# Patient Record
Sex: Female | Born: 2000 | Race: Black or African American | Hispanic: No | Marital: Single | Smoking: Never smoker
Health system: Southern US, Community
[De-identification: ages and names within clinical notes are randomized; demographics above are authoritative.]

## PROBLEM LIST (undated history)

## (undated) DIAGNOSIS — E109 Type 1 diabetes mellitus without complications: Secondary | ICD-10-CM

## (undated) DIAGNOSIS — F329 Major depressive disorder, single episode, unspecified: Secondary | ICD-10-CM

## (undated) DIAGNOSIS — I10 Essential (primary) hypertension: Secondary | ICD-10-CM

## (undated) DIAGNOSIS — F32A Depression, unspecified: Secondary | ICD-10-CM

## (undated) HISTORY — DX: Major depressive disorder, single episode, unspecified: F32.9

## (undated) HISTORY — DX: Depression, unspecified: F32.A

## (undated) HISTORY — DX: Type 1 diabetes mellitus without complications: E10.9

## (undated) HISTORY — DX: Essential (primary) hypertension: I10

## (undated) HISTORY — PX: NO PAST SURGERIES: SHX2092

---
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
## 2020-05-14 ENCOUNTER — Encounter: Payer: Self-pay | Admitting: Family Medicine

## 2020-05-14 ENCOUNTER — Other Ambulatory Visit: Payer: Self-pay

## 2020-05-14 ENCOUNTER — Other Ambulatory Visit (HOSPITAL_COMMUNITY)
Admission: RE | Admit: 2020-05-14 | Discharge: 2020-05-14 | Disposition: A | Discharge status: Home / Self Care | Payer: Managed Care, Other (non HMO) | Source: Ambulatory Visit | Attending: Family Medicine | Admitting: Family Medicine

## 2020-05-14 ENCOUNTER — Ambulatory Visit (INDEPENDENT_AMBULATORY_CARE_PROVIDER_SITE_OTHER): Payer: Managed Care, Other (non HMO) | Admitting: Family Medicine

## 2020-05-14 VITALS — BP 118/68 | HR 79 | Temp 98.2°F | Ht 70.0 in | Wt 162.4 lb

## 2020-05-14 DIAGNOSIS — Z114 Encounter for screening for human immunodeficiency virus [HIV]: Secondary | ICD-10-CM | POA: Diagnosis not present

## 2020-05-14 DIAGNOSIS — Z113 Encounter for screening for infections with a predominantly sexual mode of transmission: Secondary | ICD-10-CM | POA: Insufficient documentation

## 2020-05-14 DIAGNOSIS — E1065 Type 1 diabetes mellitus with hyperglycemia: Secondary | ICD-10-CM | POA: Diagnosis not present

## 2020-05-14 DIAGNOSIS — G43109 Migraine with aura, not intractable, without status migrainosus: Secondary | ICD-10-CM

## 2020-05-14 DIAGNOSIS — R110 Nausea: Secondary | ICD-10-CM

## 2020-05-14 MED ORDER — AMITRIPTYLINE HCL 25 MG PO TABS: 25.0000 mg | ORAL_TABLET | Freq: Every day | ORAL | 2 refills | Status: DC

## 2020-05-14 MED ORDER — SUMATRIPTAN SUCCINATE 100 MG PO TABS: 100.0000 mg | ORAL_TABLET | ORAL | 2 refills | Status: DC | PRN

## 2020-05-14 NOTE — Progress Notes (Signed)
Chief Complaint  Patient presents with  . Exposure to STD    Subjective: Patient is a 20 y.o. female here for STI screen.  BF cheated on her and did not use protection. No fevers, pain, bleeding, d/c or other s/s's. She has some nausea. LMP was 1 week ago.   Migraines H xof migraines, restarted 3 weeks ago. Father passed away around 1 mo ago. R sided HA lasting up to the whole day. Tylenol, Aleve, Advil that is not helpful. Will get around 10-12 HA's per week. Nothing has been helpful outside of sleep. No known triggers. No neuro s/s's, vision changes.   Past Medical History:  Diagnosis Date  . Depression   . Diabetes mellitus type I (HCC)    Follows with Endo  . Hypertension     Objective: BP 118/68 (BP Location: Left Arm, Patient Position: Sitting, Cuff Size: Normal)   Pulse 79   Temp 98.2 F (36.8 C) (Oral)   Ht 5\' 10"  (1.778 m)   Wt 162 lb 6 oz (73.7 kg)   SpO2 98%   BMI 23.30 kg/m  General: Awake, appears stated age Abd: BS+, S, NT, ND Heart: RRR Lungs: CTAB, no rales, wheezes or rhonchi. No accessory muscle use Neuro: DTR's equal and symmetric, no clonus, no cerebellar signs, 5/5 strength throughout MSK: mild ttp over R TMJ, no clicking or deviation Psych: Age appropriate judgment and insight, normal affect and mood  Assessment and Plan: Routine screening for STI (sexually transmitted infection) - Plan: Urine cytology ancillary only([place])  Screening for HIV (human immunodeficiency virus) - Plan: HIV Antibody (routine testing w rflx)  Migraine with aura and without status migrainosus, not intractable - Plan: SUMAtriptan (IMITREX) 100 MG tablet, amitriptyline (ELAVIL) 25 MG tablet  Type 1 diabetes mellitus with hyperglycemia (HCC), Chronic  Nausea - Plan: Pregnancy, urine  1/2. Screening as above. Asymptomatic.  3. Trial Imitrex. Start Elavil for prophylaxis as she is having stress from recent death in family also.  F/u in 1 mo to reck 3.  The patient  voiced understanding and agreement to the plan.  [place], DO 05/14/20  9:03 AM

## 2020-05-14 NOTE — Patient Instructions (Addendum)
Give [place] a week or so to get the results of your testing back.   Practice safe sex.  Hold off on the gum chewing or dial back over the next month or so.   Let us know if you need anything.

## 2020-05-14 NOTE — Addendum Note (Signed)
Addended by: Radene Gunning on: 05/14/2020 09:04 AM   Modules accepted: Level of Service

## 2020-05-15 LAB — URINE CYTOLOGY ANCILLARY ONLY
Chlamydia: NEGATIVE
Comment: NEGATIVE
Comment: NEGATIVE
Comment: NORMAL
Neisseria Gonorrhea: NEGATIVE
Trichomonas: NEGATIVE

## 2020-05-15 LAB — HIV ANTIBODY (ROUTINE TESTING W REFLEX): HIV 1&2 Ab, 4th Generation: NONREACTIVE

## 2020-05-15 LAB — PREGNANCY, URINE: Preg Test, Ur: NEGATIVE

## 2020-06-11 ENCOUNTER — Encounter: Payer: Self-pay | Admitting: Family Medicine

## 2020-06-11 ENCOUNTER — Ambulatory Visit (INDEPENDENT_AMBULATORY_CARE_PROVIDER_SITE_OTHER): Payer: Managed Care, Other (non HMO) | Admitting: Family Medicine

## 2020-06-11 ENCOUNTER — Other Ambulatory Visit: Payer: Self-pay

## 2020-06-11 VITALS — BP 118/68 | HR 96 | Temp 98.4°F | Ht 70.0 in | Wt 162.0 lb

## 2020-06-11 DIAGNOSIS — G43109 Migraine with aura, not intractable, without status migrainosus: Secondary | ICD-10-CM | POA: Diagnosis not present

## 2020-06-11 DIAGNOSIS — L609 Nail disorder, unspecified: Secondary | ICD-10-CM | POA: Diagnosis not present

## 2020-06-11 MED ORDER — PROPRANOLOL HCL ER 60 MG PO CP24: 60.0000 mg | ORAL_CAPSULE | Freq: Every day | ORAL | 1 refills | Status: DC

## 2020-06-11 MED ORDER — RIZATRIPTAN BENZOATE 10 MG PO TABS: 10.0000 mg | ORAL_TABLET | ORAL | 2 refills | Status: DC | PRN

## 2020-06-11 NOTE — Patient Instructions (Addendum)
Let me know if you are trying to get pregnant again.   If you do not hear anything about your referral in the next 1-2 weeks, call our office and ask for an update.  Let us know if you need anything.

## 2020-06-11 NOTE — Progress Notes (Signed)
Chief Complaint  Patient presents with  . Follow-up    Subjective: Patient is a 20 y.o. female here for f/u.  Patient was started on amitriptyline 25 mg nightly and Imitrex 100 mg as needed.  They both worked but they both made her drowsy.  Her headaches went from 10 to 12/week to around 2/week.  Severity slightly reduced as well.  She had no other side effects.  Imitrex sedated her but did help with her headaches.  No new neurologic signs or symptoms.  Patient stubbed her toe 3 months ago.  It has not grown back.  She is interested in seeing a specialist.  It does not cause her any pain or itching.  No redness or drainage.  Past Medical History:  Diagnosis Date  . Depression   . Diabetes mellitus type I (HCC)    Follows with Endo  . Hypertension     Objective: BP 118/68 (BP Location: Left Arm, Patient Position: Sitting, Cuff Size: Normal)   Pulse 96   Temp 98.4 F (36.9 C) (Oral)   Ht 5\' 10"  (1.778 m)   Wt 162 lb (73.5 kg)   SpO2 98%   BMI 23.24 kg/m  General: Awake, appears stated age HEENT: MMM, EOMi MSK: No tenderness over the cervical paraspinal musculature or suboccipital triangle, no TMJ tenderness or TTP over the temporalis bilaterally Neuro: DTRs equal and symmetric throughout, 5/5 strength throughout, no clonus, no cerebellar signs Lungs: No accessory muscle use Skin: The left great toe, there is a lateral jagged portion of the nail cut out.  There is no tenderness upon pressure upon the nail plate, there is no erythema, ecchymosis, or drainage around the nail folds. Psych: Age appropriate judgment and insight, normal affect and mood  Assessment and Plan: Migraine with aura and without status migrainosus, not intractable - Plan: rizatriptan (MAXALT) 10 MG tablet, propranolol ER (INDERAL LA) 60 MG 24 hr capsule  Nail problem - Plan: Ambulatory referral to Podiatry  1.  Chronic, adverse effect to medication.  Change Imitrex to Maxalt.  Change amitriptyline to  propranolol.  Follow-up in 4 weeks. 2.  Refer to podiatry for further evaluation.  I do not think avulsion would be appropriate given lack of pain today. The patient voiced understanding and agreement to the plan.  [place], DO 06/11/20  9:32 AM

## 2020-06-17 ENCOUNTER — Other Ambulatory Visit: Payer: Self-pay

## 2020-06-17 ENCOUNTER — Ambulatory Visit (INDEPENDENT_AMBULATORY_CARE_PROVIDER_SITE_OTHER): Payer: Managed Care, Other (non HMO) | Admitting: Podiatry

## 2020-06-17 VITALS — BP 110/85 | HR 92 | Temp 97.9°F

## 2020-06-17 DIAGNOSIS — B351 Tinea unguium: Secondary | ICD-10-CM | POA: Diagnosis not present

## 2020-06-17 DIAGNOSIS — M79676 Pain in unspecified toe(s): Secondary | ICD-10-CM

## 2020-06-17 MED ORDER — TERBINAFINE HCL 250 MG PO TABS: 250.0000 mg | ORAL_TABLET | Freq: Every day | ORAL | 0 refills | Status: DC

## 2020-06-17 NOTE — Progress Notes (Signed)
   Subjective: 20 y.o. female presenting today as a new patient for evaluation of thickening to the bilateral great toenails that has been present for a few years now.  Patient denies a history of trauma.  She does state that she works 2 jobs and is on her feet throughout most of the day.  She presents for further treatment and evaluation  Past Medical History:  Diagnosis Date  . Depression   . Diabetes mellitus type I (HCC)    Follows with Endo  . Hypertension     Objective: Physical Exam General: The patient is alert and oriented x3 in no acute distress.  Dermatology: Hyperkeratotic, discolored, thickened, onychodystrophy noted bilateral great toenails. Skin is warm, dry and supple bilateral lower extremities. Negative for open lesions or macerations.  Vascular: Palpable pedal pulses bilaterally. No edema or erythema noted. Capillary refill within normal limits.  Neurological: Epicritic and protective threshold grossly intact bilaterally.   Musculoskeletal Exam: Range of motion within normal limits to all pedal and ankle joints bilateral. Muscle strength 5/5 in all groups bilateral.   Assessment: #1 Onychomycosis of toenails bilateral halluces  Plan of Care:  #1 Patient was evaluated. #2 today we discussed different treatment options regarding onychomycosis of the toenails including oral, topical, and laser antifungal treatment modalities.  Patient opts for oral antifungal medication.  She denies a history of liver pathology or symptoms. #3 prescription for Lamisil 250 mg #90 daily 4.  Return to clinic 6 months   Felecia Shelling, DPM [facility]  Dr. Felecia Shelling, DPM    [address] [place], [place] [address]                Office [phone]  Fax [phone]

## 2020-07-10 ENCOUNTER — Ambulatory Visit (INDEPENDENT_AMBULATORY_CARE_PROVIDER_SITE_OTHER): Payer: Managed Care, Other (non HMO) | Admitting: Family Medicine

## 2020-07-10 ENCOUNTER — Encounter: Payer: Self-pay | Admitting: Family Medicine

## 2020-07-10 ENCOUNTER — Other Ambulatory Visit: Payer: Self-pay

## 2020-07-10 VITALS — BP 120/70 | HR 82 | Temp 97.9°F | Ht 70.0 in | Wt 161.1 lb

## 2020-07-10 DIAGNOSIS — G43109 Migraine with aura, not intractable, without status migrainosus: Secondary | ICD-10-CM | POA: Diagnosis not present

## 2020-07-10 DIAGNOSIS — J302 Other seasonal allergic rhinitis: Secondary | ICD-10-CM

## 2020-07-10 MED ORDER — LEVOCETIRIZINE DIHYDROCHLORIDE 5 MG PO TABS: 5.0000 mg | ORAL_TABLET | Freq: Every evening | ORAL | 2 refills | Status: DC

## 2020-07-10 MED ORDER — FLUTICASONE PROPIONATE 50 MCG/ACT NA SUSP: 2.0000 | Freq: Every day | NASAL | 2 refills | Status: DC

## 2020-07-10 MED ORDER — PROPRANOLOL HCL ER 60 MG PO CP24: 60.0000 mg | ORAL_CAPSULE | Freq: Every day | ORAL | 10 refills | Status: DC

## 2020-07-10 NOTE — Progress Notes (Signed)
Chief Complaint  Patient presents with   Follow-up    Subjective: Patient is a 20 y.o. female here for f/u headaches.  Started on Propranolol LA 60 mg/d. Doing well, compliant, no AE's. When she has to use Maxalt, it controls her headaches. She is not having any drowsiness and does not wish to change her medication at this time.   Seasonal allergies Usually affected by pollen. Over the past 3 d, started having nasal congestion, sneezing, itchy/watery eyes, sinus pressure. No coughing, fevers, ST, ear pain, sob, wheezing. Has not tried anything so far.   Past Medical History:  Diagnosis Date   Depression    Diabetes mellitus type I (HCC)    Follows with Endo   Hypertension     Objective: BP 120/70   Pulse 82   Temp 97.9 F (36.6 C) (Oral)   Ht 5\' 10"  (1.778 m)   Wt 161 lb 2 oz (73.1 kg)   SpO2 98%   BMI 23.12 kg/m  General: Awake, appears stated age HEENT: MMM, EOMi, pares patent w turbinates edematous and pale b/l, ears neg b/l Heart: RRR Lungs: CTAB, no rales, wheezes or rhonchi. No accessory muscle use Psych: Age appropriate judgment and insight, normal affect and mood  Assessment and Plan: Migraine with aura and without status migrainosus, not intractable - Plan: propranolol ER (INDERAL LA) 60 MG 24 hr capsule  Seasonal allergies - Plan: levocetirizine (XYZAL) 5 MG tablet, fluticasone (FLONASE) 50 MCG/ACT nasal spray  Chronic, stable. Cont Propranolol LA 60 mg/d.  Chronic, uncontrolled. Start Xyzal 5 mg/d and INCS. OTC options provided as well. F/u in 6 mo for CPE or prn.  The patient voiced understanding and agreement to the plan.  [place], DO 07/10/20  9:19 AM

## 2020-07-10 NOTE — Patient Instructions (Signed)
Claritin (loratadine), Allegra (fexofenadine), Zyrtec (cetirizine) which is also equivalent to Xyzal (levocetirizine); these are listed in order from weakest to strongest. Generic, and therefore cheaper, options are in the parentheses.  ? ?Flonase (fluticasone); nasal spray that is over the counter. 2 sprays each nostril, once daily. Aim towards the same side eye when you spray. ? ?There are available OTC, and the generic versions, which may be cheaper, are in parentheses. Show this to a pharmacist if you have trouble finding any of these items. ? ?Let us know if you need anything. ?

## 2020-08-21 ENCOUNTER — Other Ambulatory Visit: Payer: Self-pay | Admitting: Family Medicine

## 2020-08-21 DIAGNOSIS — G43109 Migraine with aura, not intractable, without status migrainosus: Secondary | ICD-10-CM

## 2021-01-03 ENCOUNTER — Ambulatory Visit (INDEPENDENT_AMBULATORY_CARE_PROVIDER_SITE_OTHER): Payer: Managed Care, Other (non HMO) | Admitting: Family Medicine

## 2021-01-03 ENCOUNTER — Encounter: Payer: Self-pay | Admitting: Family Medicine

## 2021-01-03 VITALS — BP 106/64 | HR 92 | Temp 98.1°F | Resp 16 | Ht 71.0 in | Wt 160.2 lb

## 2021-01-03 DIAGNOSIS — R599 Enlarged lymph nodes, unspecified: Secondary | ICD-10-CM

## 2021-01-03 MED ORDER — PREDNISONE 20 MG PO TABS
40.0000 mg | ORAL_TABLET | Freq: Every day | ORAL | 0 refills | Status: AC
Start: 2021-01-03 — End: 2021-01-08

## 2021-01-03 NOTE — Patient Instructions (Addendum)
Ice/cold pack over area for 10-15 min twice daily.  This should go away with time on its own.  If things get painful again, take the prednisone. Don't get pregnant on this medication.   OK to take Tylenol 1000 mg (2 extra strength tabs) or 975 mg (3 regular strength tabs) every 6 hours as needed.  Let us know if you need anything.

## 2021-01-03 NOTE — Progress Notes (Signed)
Chief Complaint  Patient presents with   knot under left ear and neck    Tina Fry is a 20 y.o. female here for a skin complaint.  Duration: 10 days Location: behind L ear Pruritic? No Painful? If she presses on it Drainage? No Other associated symptoms: had a fever (son had the flu) last week and diagnosed with the flu Therapies tried thus far: Tylenol  Past Medical History:  Diagnosis Date   Depression    Diabetes mellitus type I (HCC)    Follows with Endo   Hypertension     BP 106/64    Pulse 92    Temp 98.1 F (36.7 C)    Resp 16    Ht 5\' 11"  (1.803 m)    Wt 160 lb 3.2 oz (72.7 kg)    SpO2 98%    BMI 22.34 kg/m  Gen: awake, alert, appearing stated age Lungs: No accessory muscle use Skin: Periumbilical and circular lesion behind the left clear that is mildly tender to palpation, measuring approximately 0.6 cm in diameter.  There is no fluctuance, erythema, drainage.  Behind the left mandible, there is another circular and freely movable lesion measuring approximately 0.7 cm in diameter without erythema, drainage, fluctuance, or excoriation.  No drainage, erythema, TTP, fluctuance, excoriation Psych: Age appropriate judgment and insight  Reactive lymphadenopathy - Plan: predniSONE (DELTASONE) 20 MG tablet  She had the flu last week.  This is likely a reactive lymph node.  Should self resolve.  If she gets higher amounts of pain, I did send in a 5-day prednisone burst 40 mg daily.  I do not think she will likely need this.  Ice, Tylenol, try not to touch it. F/u prn. The patient voiced understanding and agreement to the plan.  [place], DO 01/03/21 12:27 PM

## 2021-01-10 ENCOUNTER — Ambulatory Visit (INDEPENDENT_AMBULATORY_CARE_PROVIDER_SITE_OTHER): Payer: Managed Care, Other (non HMO) | Admitting: Family Medicine

## 2021-01-10 ENCOUNTER — Encounter: Payer: Self-pay | Admitting: Family Medicine

## 2021-01-10 ENCOUNTER — Other Ambulatory Visit: Payer: Managed Care, Other (non HMO)

## 2021-01-10 ENCOUNTER — Telehealth: Payer: Self-pay

## 2021-01-10 VITALS — BP 120/70 | HR 74 | Temp 98.0°F | Ht 70.0 in | Wt 162.1 lb

## 2021-01-10 DIAGNOSIS — Z23 Encounter for immunization: Secondary | ICD-10-CM

## 2021-01-10 DIAGNOSIS — Z1159 Encounter for screening for other viral diseases: Secondary | ICD-10-CM | POA: Diagnosis not present

## 2021-01-10 DIAGNOSIS — E1065 Type 1 diabetes mellitus with hyperglycemia: Secondary | ICD-10-CM

## 2021-01-10 DIAGNOSIS — Z0000 Encounter for general adult medical examination without abnormal findings: Secondary | ICD-10-CM

## 2021-01-10 LAB — HEPATITIS C ANTIBODY
Hepatitis C Ab: NONREACTIVE
SIGNAL TO CUT-OFF: 0.03 (ref <1.00)

## 2021-01-10 LAB — LIPID PANEL
Cholesterol: 142 mg/dL (ref 0–200)
HDL: 53.4 mg/dL (ref >39.00)
LDL Cholesterol: 63 mg/dL (ref 0–99)
NonHDL: 88.34
Total CHOL/HDL Ratio: 3
Triglycerides: 125 mg/dL (ref 0.0–149.0)
VLDL: 25 mg/dL (ref 0.0–40.0)

## 2021-01-10 LAB — COMPREHENSIVE METABOLIC PANEL
ALT: 8 U/L (ref 0–35)
AST: 8 U/L (ref 0–37)
Albumin: 4.4 g/dL (ref 3.5–5.2)
Alkaline Phosphatase: 64 U/L (ref 39–117)
BUN: 15 mg/dL (ref 6–23)
CO2: 25 mEq/L (ref 19–32)
Calcium: 9.8 mg/dL (ref 8.4–10.5)
Chloride: 95 mEq/L — ABNORMAL LOW (ref 96–112)
Creatinine, Ser: 0.85 mg/dL (ref 0.40–1.20)
GFR: 98.17 mL/min (ref >60.00)
Glucose, Bld: 638 mg/dL (ref 70–99)
Potassium: 4.3 mEq/L (ref 3.5–5.1)
Sodium: 130 mEq/L — ABNORMAL LOW (ref 135–145)
Total Bilirubin: 0.5 mg/dL (ref 0.2–1.2)
Total Protein: 7.1 g/dL (ref 6.0–8.3)

## 2021-01-10 LAB — CBC
HCT: 40.9 % (ref 36.0–46.0)
Hemoglobin: 13.2 g/dL (ref 12.0–15.0)
MCHC: 32.3 g/dL (ref 30.0–36.0)
MCV: 84 fl (ref 78.0–100.0)
Platelets: 198 10*3/uL (ref 150.0–400.0)
RBC: 4.87 Mil/uL (ref 3.87–5.11)
RDW: 13.5 % (ref 11.5–15.5)
WBC: 3.7 10*3/uL — ABNORMAL LOW (ref 4.0–10.5)

## 2021-01-10 LAB — MICROALBUMIN / CREATININE URINE RATIO
Creatinine,U: 14.6 mg/dL
Microalb Creat Ratio: 4.8 mg/g (ref 0.0–30.0)
Microalb, Ur: <0.7 mg/dL (ref 0.0–1.9)

## 2021-01-10 MED ORDER — ETONOGESTREL-ETHINYL ESTRADIOL 0.12-0.015 MG/24HR VA RING
VAGINAL_RING | VAGINAL | 3 refills | Status: DC
Start: 2021-01-10 — End: 2021-10-17

## 2021-01-10 NOTE — Telephone Encounter (Signed)
She needs to reach out to her current endocrinologist. I can't give her my blessing to drink alcohol this weekend as this could send her into DKA/HHS. Ty.

## 2021-01-10 NOTE — Addendum Note (Signed)
Addended by: Scharlene Gloss B on: 01/10/2021 10:13 AM   Modules accepted: Orders

## 2021-01-10 NOTE — Telephone Encounter (Signed)
Called the patients cell twice/left message to call back. Called the home number//no answer/mailbox full

## 2021-01-10 NOTE — Telephone Encounter (Signed)
Patient called back and did inform of PCP instructions. She agreed to do.

## 2021-01-10 NOTE — Telephone Encounter (Signed)
CRITICAL VALUE STICKER  CRITICAL VALUE: Glucose 638  RECEIVER (on-site recipient of call): Melton Alar, [place]  DATE & TIME NOTIFIED: 12:54 PM, 01/10/21  MESSENGER (representative from lab): Saa  MD NOTIFIED: yes  TIME OF NOTIFICATION: 12:54 PM, 01/10/21  RESPONSE: PENDING

## 2021-01-10 NOTE — Progress Notes (Signed)
Chief Complaint  Patient presents with   Annual Exam     Well Woman NALEA SALCE is here for a complete physical.   Her last physical was >1 year ago.  Current diet: in general, a "healthy" diet. Current exercise: walking. Contraception? Yes Fatigue out of ordinary? No Seatbelt? Yes Advanced directive? No  Health Maintenance Pap/HPV- Due Tetanus- Yes HIV screening- Yes Hep C screening- No  Past Medical History:  Diagnosis Date   Depression    Diabetes mellitus type I (HCC)    Follows with Endo   Hypertension      Past Surgical History:  Procedure Laterality Date   NO PAST SURGERIES      Medications  Current Outpatient Medications on File Prior to Visit  Medication Sig Dispense Refill   Blood Glucose Monitoring Suppl (GLUCOCOM BLOOD GLUCOSE MONITOR) DEVI 1 Device by Misc.(Non-Drug; Combo Route) route 3 times daily.     etonogestrel-ethinyl estradiol (NUVARING) 0.12-0.015 MG/24HR vaginal ring Insert vaginally and leave in place for 3 consecutive weeks, then remove for 1 week.     fluticasone (FLONASE) 50 MCG/ACT nasal spray Place 2 sprays into both nostrils daily. 16 g 2   glucagon 1 MG injection Inject 1 mg into the vein once as needed.     glucose blood (ONETOUCH VERIO) test strip 1 each by Other route 4 (four) times daily. And lancets 4/day 100 each 12   insulin aspart (NOVOLOG FLEXPEN) 100 UNIT/ML FlexPen Inject 120 Units into the skin 3 (three) times daily before meals.     propranolol ER (INDERAL LA) 60 MG 24 hr capsule Take 1 capsule (60 mg total) by mouth daily. 30 capsule 10   rizatriptan (MAXALT) 10 MG tablet Take 1 tablet (10 mg total) by mouth as needed for migraine. May repeat in 2 hours if needed 10 tablet 2   terbinafine (LAMISIL) 250 MG tablet Take 1 tablet (250 mg total) by mouth daily. 90 tablet 0   Allergies No Known Allergies  Review of Systems: Constitutional:  no unexpected weight changes Eye:  no recent significant change in  vision Ear/Nose/Mouth/Throat:  Ears:  no tinnitus or vertigo and no recent change in hearing Nose/Mouth/Throat:  no complaints of nasal congestion, no sore throat Cardiovascular: no chest pain Respiratory:  no cough and no shortness of breath Gastrointestinal:  no abdominal pain, no change in bowel habits GU:  Female: negative for dysuria or pelvic pain Musculoskeletal/Extremities:  no pain of the joints Integumentary (Skin/Breast):  no abnormal skin lesions reported Neurologic:  no headaches Endocrine:  denies fatigue Hematologic/Lymphatic:  No areas of easy bleeding  Exam BP 120/70    Pulse 74    Temp 98 F (36.7 C) (Oral)    Ht 5\' 10"  (1.778 m)    Wt 162 lb 2 oz (73.5 kg)    SpO2 99%    BMI 23.26 kg/m  General:  well developed, well nourished, in no apparent distress Skin:  no significant moles, warts, or growths Head:  no masses, lesions, or tenderness Eyes:  pupils equal and round, sclera anicteric without injection Ears:  canals without lesions, TMs shiny without retraction, no obvious effusion, no erythema Nose:  nares patent, septum midline, mucosa normal, and no drainage or sinus tenderness Throat/Pharynx:  lips and gingiva without lesion; tongue and uvula midline; non-inflamed pharynx; no exudates or postnasal drainage Neck: neck supple without adenopathy, thyromegaly, or masses Lungs:  clear to auscultation, breath sounds equal bilaterally, no respiratory distress Cardio:  regular rate  and rhythm, no bruits, no LE edema Abdomen:  abdomen soft, nontender; bowel sounds normal; no masses or organomegaly Genital: Defer to GYN Musculoskeletal:  symmetrical muscle groups noted without atrophy or deformity Extremities:  no clubbing, cyanosis, or edema, no deformities, no skin discoloration Neuro:  gait normal; deep tendon reflexes normal and symmetric Psych: well oriented with normal range of affect and appropriate judgment/insight  Assessment and Plan  Well adult exam - Plan:  CBC, Comprehensive metabolic panel, Lipid panel, Hepatitis C antibody  Type 1 diabetes mellitus with hyperglycemia (HCC) - Plan: Ambulatory referral to Endocrinology, Hemoglobin A1c, Microalbumin / creatinine urine ratio  Encounter for hepatitis C screening test for low risk patient   Well 20 y.o. female. Counseled on diet and exercise. Final HPV vaccine today. Refer to endo at her request. Will ck a few things. Advanced directive form provided today.  Rec'd she reach out to OB/GYN for pap smear.  Other orders as above. Follow up in 6 mo. The patient voiced understanding and agreement to the plan.  Jilda Roche [place], DO 01/10/21 10:09 AM

## 2021-01-10 NOTE — Patient Instructions (Addendum)
Give [place] 2-3 business days to get the results of your labs back.   Keep the diet clean and stay active.  Reach out to your gynecologist for your pap smear.   If you do not hear anything about your referral in the next 1-2 weeks, call our office and ask for an update.  Let us know if you need anything.

## 2021-01-11 LAB — HEMOGLOBIN A1C: Hgb A1c MFr Bld: >14 % of total Hgb — ABNORMAL HIGH (ref <5.7)

## 2021-02-24 ENCOUNTER — Ambulatory Visit (INDEPENDENT_AMBULATORY_CARE_PROVIDER_SITE_OTHER): Payer: Managed Care, Other (non HMO) | Admitting: Family Medicine

## 2021-02-24 ENCOUNTER — Encounter: Payer: Self-pay | Admitting: Family Medicine

## 2021-02-24 VITALS — BP 100/62 | HR 60 | Temp 98.0°F | Resp 18 | Ht 70.0 in | Wt 163.0 lb

## 2021-02-24 DIAGNOSIS — L729 Follicular cyst of the skin and subcutaneous tissue, unspecified: Secondary | ICD-10-CM | POA: Diagnosis not present

## 2021-02-24 DIAGNOSIS — L089 Local infection of the skin and subcutaneous tissue, unspecified: Secondary | ICD-10-CM | POA: Diagnosis not present

## 2021-02-24 MED ORDER — CEPHALEXIN 500 MG PO CAPS: 500.0000 mg | ORAL_CAPSULE | Freq: Three times a day (TID) | ORAL | 0 refills | Status: AC

## 2021-02-24 NOTE — Progress Notes (Signed)
Chief Complaint  Patient presents with   skin check    Onset: day 9 --- cyst on back draining , tender on back     Tina Fry is a 21 y.o. female here for a skin complaint.  Duration: 9 days, started as a small pimple Location: back Pruritic? No Painful? Yes Drainage? Yes New soaps/lotions/topicals/detergents? No Sick contacts? No Other associated symptoms: growing Therapies tried thus far: none  Past Medical History:  Diagnosis Date   Depression    Diabetes mellitus type I ([place])    Follows with Endo   Hypertension     BP 100/62    Pulse 60    Temp 98 F (36.7 C)    Resp 18    Ht 5\' 10"  (1.778 m)    Wt 163 lb (73.9 kg)    LMP 01/05/2021    SpO2 98%    BMI 23.39 kg/m  Gen: awake, alert, appearing stated age Lungs: No accessory muscle use Skin: R upper back around T4/5, 4 x 4 cm circular lesion that is warm, ttp, and indurated w a central pustule. No drainage, fluctuance, excoriation Psych: Age appropriate judgment and insight  Infected cyst of skin - Plan: cephALEXin (KEFLEX) 500 MG capsule  Keflex TID for 7 d. Tylenol, ice. She is pregnant.  F/u prn. The patient voiced understanding and agreement to the plan.  [place], DO 02/24/21 10:56 AM

## 2021-02-24 NOTE — Patient Instructions (Addendum)
Ice/cold pack over area for 10-15 min twice daily.  OK to take Tylenol 1000 mg (2 extra strength tabs) or 975 mg (3 regular strength tabs) every 6 hours as needed.  When you do wash it, use only soap and water. Do not vigorously scrub. Apply triple antibiotic ointment (like Neosporin) twice daily. Keep the area clean and dry.   Things to look out for: increasing pain not relieved by acetaminophen, fevers, spreading redness, drainage of pus, or foul odor.  Let us know if you need anything.   

## 2021-07-11 ENCOUNTER — Telehealth: Payer: Self-pay | Admitting: Family Medicine

## 2021-07-11 ENCOUNTER — Ambulatory Visit (INDEPENDENT_AMBULATORY_CARE_PROVIDER_SITE_OTHER): Payer: Managed Care, Other (non HMO) | Admitting: Family Medicine

## 2021-07-11 ENCOUNTER — Encounter: Payer: Self-pay | Admitting: Family Medicine

## 2021-07-11 VITALS — BP 108/68 | HR 91 | Temp 98.0°F | Ht 70.0 in | Wt 161.4 lb

## 2021-07-11 DIAGNOSIS — M545 Low back pain, unspecified: Secondary | ICD-10-CM

## 2021-07-11 DIAGNOSIS — G43109 Migraine with aura, not intractable, without status migrainosus: Secondary | ICD-10-CM

## 2021-07-11 DIAGNOSIS — M542 Cervicalgia: Secondary | ICD-10-CM | POA: Diagnosis not present

## 2021-07-11 MED ORDER — BD PEN NEEDLE NANO U/F 32G X 4 MM MISC: 3 refills | Status: DC

## 2021-07-11 MED ORDER — TOPIRAMATE 25 MG PO TABS: ORAL_TABLET | ORAL | 1 refills | Status: DC

## 2021-07-11 MED ORDER — KETOROLAC TROMETHAMINE 60 MG/2ML IM SOLN
60.0000 mg | Freq: Once | INTRAMUSCULAR | Status: AC
  Administered 2021-07-11: 60 mg via INTRAMUSCULAR

## 2021-07-11 MED ORDER — UBRELVY 100 MG PO TABS: ORAL_TABLET | ORAL | 2 refills | Status: DC

## 2021-07-11 MED ORDER — NOVOLOG FLEXPEN 100 UNIT/ML ~~LOC~~ SOPN: 40.0000 [IU] | PEN_INJECTOR | Freq: Three times a day (TID) | SUBCUTANEOUS | 0 refills | Status: DC

## 2021-07-11 MED ORDER — NOVOLOG FLEXPEN 100 UNIT/ML ~~LOC~~ SOPN: 120.0000 [IU] | PEN_INJECTOR | Freq: Three times a day (TID) | SUBCUTANEOUS | 0 refills | Status: DC

## 2021-07-11 NOTE — Addendum Note (Signed)
Addended by: Scharlene Gloss B on: 07/11/2021 01:17 PM   Modules accepted: Orders

## 2021-07-11 NOTE — Telephone Encounter (Signed)
Pharmacy is calling in regards of the Novolog sent to them today, the pharmacist stated the amount it says to dispense in a day is too much and they need clarification. The would like a call back whenever possible. Please advise.   [id number] - [place], [place] - [address]  [address], [place]  [address]  Phone:  [phone]  Fax:  [phone]

## 2021-07-11 NOTE — Telephone Encounter (Signed)
Called and was on hold for over 8 mins.

## 2021-07-11 NOTE — Telephone Encounter (Signed)
Clarified with the patient //per PCP to change to 40 units tid. Pharmacist informed and prescription sent in.

## 2021-07-11 NOTE — Progress Notes (Signed)
Chief Complaint  Patient presents with   Follow-up    6 month Migraines are no better Back pain     Subjective: Patient is a 21 y.o. female here for f/u migraines.  Currently on propranolol LA 60 mg/d and Maxalt 10 mg prn. She reports compliance with the former w no AE's. The latter does not work well so she stopped. The former took away some of the severity, but still having a HA. Currently has a 6/10 HA. No new neuro s/s's.   Over the last 2 weeks, the patient has had worsening upper back/neck pain and lower back pain.  No injury or change in activity.  She used to see a [place] in her teens.  No new neurologic signs or symptoms.  Past Medical History:  Diagnosis Date   Depression    Diabetes mellitus type I (HCC)    Follows with Endo   Hypertension     Objective: BP 108/68   Pulse 91   Temp 98 F (36.7 C) (Oral)   Ht 5\' 10"  (1.778 m)   Wt 161 lb 6 oz (73.2 kg)   BMI 23.15 kg/m  General: Awake, appears stated age Neuro: DTR's equal and symmetric throughout, no clonus, 5/5 strength throughout MSK: No TTP over the temporalis or TMJ, mild TTP over the lower cervical/upper thoracic right paraspinal musculature and right upper lumbar paraspinal musculature; no TTP over the suboccipital triangle Lungs:  No accessory muscle use Psych: Age appropriate judgment and insight, normal affect and mood  Assessment and Plan: Migraine with aura and without status migrainosus, not intractable - Plan: Ubrogepant (UBRELVY) 100 MG TABS, topiramate (TOPAMAX) 25 MG tablet  Neck pain  Acute right-sided low back pain without sciatica  Chronic, unstable.  Toradol injection today. Start Ubrelvy as needed, start Topamax as needed.  She is on contraception and has no plans to conceive. She will let me know if things change.  Heat, ice, Tylenol, stretches/exercises. Heat, ice, Tylenol, stretches/exercises. PT if no better.  F/u in 1 mo to reck #1.  The patient voiced understanding and  agreement to the plan.  [place], DO 07/11/21  1:02 PM

## 2021-07-11 NOTE — Patient Instructions (Addendum)
Let me know if things are too expensive.   Don't get pregnant on these medications. If you change your mind and want another, let me know before doing so please.  Heat (pad or rice pillow in microwave) over affected area, 10-15 minutes twice daily.   Ice/cold pack over area for 10-15 min twice daily.  OK to take Tylenol 1000 mg (2 extra strength tabs) or 975 mg (3 regular strength tabs) every 6 hours as needed  Let us know if you need anything.

## 2021-07-21 ENCOUNTER — Other Ambulatory Visit: Payer: Self-pay | Admitting: Family Medicine

## 2021-07-29 ENCOUNTER — Other Ambulatory Visit: Payer: Self-pay | Admitting: Family Medicine

## 2021-08-06 ENCOUNTER — Other Ambulatory Visit: Payer: Self-pay | Admitting: Family Medicine

## 2021-08-11 ENCOUNTER — Ambulatory Visit (INDEPENDENT_AMBULATORY_CARE_PROVIDER_SITE_OTHER): Payer: Managed Care, Other (non HMO) | Admitting: Family Medicine

## 2021-08-11 ENCOUNTER — Telehealth: Payer: Self-pay | Admitting: *Deleted

## 2021-08-11 ENCOUNTER — Encounter: Payer: Self-pay | Admitting: Family Medicine

## 2021-08-11 VITALS — BP 108/68 | HR 92 | Temp 97.7°F | Ht 70.0 in | Wt 161.4 lb

## 2021-08-11 DIAGNOSIS — G43109 Migraine with aura, not intractable, without status migrainosus: Secondary | ICD-10-CM

## 2021-08-11 MED ORDER — UBRELVY 100 MG PO TABS: ORAL_TABLET | ORAL | 2 refills | Status: DC

## 2021-08-11 MED ORDER — TOPIRAMATE 25 MG PO TABS: 25.0000 mg | ORAL_TABLET | Freq: Two times a day (BID) | ORAL | 1 refills | Status: DC

## 2021-08-11 NOTE — Telephone Encounter (Signed)
Received Approval from 08/11/2021 to 08/11/2022. Patient/pharmacy made aware.

## 2021-08-11 NOTE — Progress Notes (Signed)
Chief Complaint  Patient presents with   Follow-up    Subjective: Patient is a 21 y.o. female here for f/u migraines.  Patient recently started Topamax but is only been taking 25 mg daily as she got confused by the uptitrating process.  She reports compliance with this and no adverse effects.  She has been taking Ubrelvy daily.  Her headaches have gotten much better overall.  Both the frequency and severity has decreased.  She is not having any adverse effects from her medications and they are affordable.  No weakness, balance issues, difficulty swallowing, trouble with speech, or vision changes.  Past Medical History:  Diagnosis Date   Depression    Diabetes mellitus type I (HCC)    Follows with Endo   Hypertension     Objective: BP 108/68   Pulse 92   Temp 97.7 F (36.5 C) (Oral)   Ht 5\' 10"  (1.778 m)   Wt 161 lb 6 oz (73.2 kg)   SpO2 99%   BMI 23.15 kg/m  General: Awake, appears stated age Neuro: DTRs equal and symmetric throughout, gait normal, no cerebellar signs, 5/5 strength throughout Lungs: No accessory muscle use MSK: No TTP over the suboccipital triangle region or cervical paraspinal musculature bilaterally Psych: Age appropriate judgment and insight, normal affect and mood  Assessment and Plan: Migraine with aura and without status migrainosus, not intractable - Plan: Ubrogepant (UBRELVY) 100 MG TABS, topiramate (TOPAMAX) 25 MG tablet  Chronic, stable.  Continue Topamax but increase to 25 mg twice daily and only use Ubrelvy as needed.  She will let me know if she has issues moving forward.  I will see her in 6 months for physical or as needed. The patient voiced understanding and agreement to the plan.  [place], DO 08/11/21  1:15 PM

## 2021-08-11 NOTE — Telephone Encounter (Signed)
Prior auth started via cover my meds.  Awaiting determination.   Key: [id number]

## 2021-08-11 NOTE — Patient Instructions (Signed)
Let me know if there are issues coming off of the Ubrelvy.   Let us know if you need anything.

## 2021-08-14 ENCOUNTER — Other Ambulatory Visit: Payer: Self-pay | Admitting: Family Medicine

## 2021-08-22 ENCOUNTER — Other Ambulatory Visit: Payer: Self-pay | Admitting: Family Medicine

## 2021-08-26 ENCOUNTER — Telehealth: Payer: Self-pay | Admitting: Family Medicine

## 2021-08-26 NOTE — Telephone Encounter (Signed)
PCP completed FMLA Have faxed to [facility] at [phone] Received fax confirmation Patient informed paperwork completed and faxed to [facility].

## 2021-08-30 ENCOUNTER — Other Ambulatory Visit: Payer: Self-pay | Admitting: Family Medicine

## 2021-09-01 ENCOUNTER — Encounter: Payer: Self-pay | Admitting: Internal Medicine

## 2021-09-01 ENCOUNTER — Ambulatory Visit (INDEPENDENT_AMBULATORY_CARE_PROVIDER_SITE_OTHER): Payer: Managed Care, Other (non HMO) | Admitting: Internal Medicine

## 2021-09-01 VITALS — BP 110/70 | HR 100 | Ht 70.0 in | Wt 161.8 lb

## 2021-09-01 DIAGNOSIS — E1065 Type 1 diabetes mellitus with hyperglycemia: Secondary | ICD-10-CM

## 2021-09-01 LAB — POCT GLYCOSYLATED HEMOGLOBIN (HGB A1C): HbA1c POC (<> result, manual entry): >15 % (ref 4.0–5.6)

## 2021-09-01 LAB — POCT GLUCOSE (DEVICE FOR HOME USE): Glucose Fasting, POC: 567 mg/dL — AB (ref 70–99)

## 2021-09-01 MED ORDER — INSULIN PEN NEEDLE 32G X 4 MM MISC: 1.0000 | Freq: Four times a day (QID) | 3 refills | Status: DC

## 2021-09-01 MED ORDER — NOVOLOG FLEXPEN 100 UNIT/ML ~~LOC~~ SOPN: PEN_INJECTOR | SUBCUTANEOUS | 3 refills | Status: DC

## 2021-09-01 MED ORDER — DEXCOM G7 SENSOR MISC: 1.0000 | 3 refills | Status: DC

## 2021-09-01 MED ORDER — LANTUS SOLOSTAR 100 UNIT/ML ~~LOC~~ SOPN
20.0000 [IU] | PEN_INJECTOR | Freq: Every day | SUBCUTANEOUS | 11 refills | Status: DC
Start: 2021-09-01 — End: 2021-09-08

## 2021-09-01 NOTE — Progress Notes (Signed)
Name: Tina Fry  MRN/ DOB: [id number], [birth year]   Age/ Sex: 21 y.o., female    PCP: Sharlene Dory, DO   Reason for Endocrinology Evaluation: Type 1 Diabetes Mellitus     Date of Initial Endocrinology Visit: 09/01/2021     PATIENT IDENTIFIER: Tina Fry is a 21 y.o. female with a past medical history of T1DM, migraine headaches. The patient presented for initial endocrinology clinic visit on 09/01/2021 for consultative assistance with her diabetes management.    HPI: Tina Fry was    Diagnosed with DM at age 46  Prior Medications tried/Intolerance: Metformin-  no intolerance  Currently checking blood sugars occasionally  Hypoglycemia episodes : no                Hemoglobin A1c has ranged from 12.6% in 2015, peaking at >15.0% in 2019. Patient required assistance for hypoglycemia: no Patient has required hospitalization within the last 1 year from hyper or hypoglycemia: yes 12/2020. Last DKA 2016  In terms of diet, the patient eats 3 meals a day, snacks 3x a day. Avoids sugar sweetened beverages .    S/P vaginal deliver 05/2019   HOME DIABETES REGIMEN: Novolog 40 units TID QAC    Statin: no ACE-I/ARB: no    METER DOWNLOAD SUMMARY: did not bring     DIABETIC COMPLICATIONS: Microvascular complications:   Denies: CKD , retinopathy, neuropathy  Last eye exam: Completed 2021  Macrovascular complications:   Denies: CAD, PVD, CVA   PAST HISTORY: Past Medical History:  Past Medical History:  Diagnosis Date   Depression    Diabetes mellitus type I (HCC)    Follows with Endo   Hypertension    Past Surgical History:  Past Surgical History:  Procedure Laterality Date   NO PAST SURGERIES      Social History:  reports that she has never smoked. She has been exposed to tobacco smoke. She has never used smokeless tobacco. She reports that she does not drink alcohol and does not use drugs. Family History:  Family History  Problem Relation Age  of Onset   Anxiety disorder Mother    Depression Mother    Gestational diabetes Mother    Drug abuse Father    Diabetes Mellitus I Maternal Aunt        Maternal great aunt   Diabetes Mellitus II Paternal Uncle    Diabetes Mellitus II Paternal Grandfather    ADD / ADHD Paternal Grandfather    Diabetes Paternal Grandfather    Drug abuse Paternal Grandfather    Lupus Maternal Grandmother    Hypertension Maternal Grandmother    Alcohol abuse Maternal Grandfather    Depression Maternal Grandfather    Drug abuse Maternal Grandfather    Mental illness Maternal Grandfather    Alcohol abuse Paternal Grandmother    Drug abuse Paternal Grandmother      HOME MEDICATIONS: Allergies as of 09/01/2021   No Known Allergies      Medication List        Accurate as of September 01, 2021  1:35 PM. If you have any questions, ask your nurse or doctor.          STOP taking these medications    fluticasone 50 MCG/ACT nasal spray Commonly known as: FLONASE Stopped by: Scarlette Shorts, MD       TAKE these medications    Dexcom G7 Sensor Misc 1 Device by Does not apply route as directed. Started by: Scarlette Shorts,  MD   etonogestrel-ethinyl estradiol 0.12-0.015 MG/24HR vaginal ring Commonly known as: NUVARING Insert vaginally and leave in place for 3 consecutive weeks, then remove for 1 week.   glucagon 1 MG injection Inject 1 mg into the vein once as needed.   GlucoCom Blood Glucose Monitor Devi 1 Device by [employer].(Non-Drug; Combo Route) route 3 times daily.   glucose blood test strip Commonly known as: OneTouch Verio 1 each by Other route 4 (four) times daily. And lancets 4/day   Insulin Pen Needle 32G X 4 MM Misc 1 Device by Does not apply route in the morning, at noon, in the evening, and at bedtime. What changed:  how much to take how to take this when to take this additional instructions Changed by: Scarlette Shorts, MD   Lantus SoloStar 100 UNIT/ML  Solostar Pen Generic drug: insulin glargine Inject 20 Units into the skin daily. Started by: Scarlette Shorts, MD   NovoLOG FlexPen 100 UNIT/ML FlexPen Generic drug: insulin aspart Max daily 80 units What changed: See the new instructions. Changed by: Scarlette Shorts, MD   terbinafine 250 MG tablet Commonly known as: LamISIL Take 1 tablet (250 mg total) by mouth daily.   topiramate 25 MG tablet Commonly known as: TOPAMAX Take 1 tablet (25 mg total) by mouth 2 (two) times daily. Take 1 tab/dayfor 3 days then 1 tab 2x/day for 3 days. Add a pill in AM and then PM over 3 day intervals until taking 2 pills 2x/day.   Ubrelvy 100 MG Tabs Generic drug: Ubrogepant Take 1 tab in event of a migraine, repeat in 2 hours if no improvement.         ALLERGIES: No Known Allergies   REVIEW OF SYSTEMS: A comprehensive ROS was conducted with the patient and is negative except as per HPI and below:  Review of Systems  Eyes:  Negative for blurred vision.  Gastrointestinal:  Negative for diarrhea, nausea and vomiting.      OBJECTIVE:   VITAL SIGNS: BP 110/70 (BP Location: Left Arm, Patient Position: Sitting, Cuff Size: Small)   Pulse 100   Ht 5\' 10"  (1.778 m)   Wt 161 lb 12.8 oz (73.4 kg)   SpO2 98%   BMI 23.22 kg/m    PHYSICAL EXAM:  General: Pt appears well and is in NAD  Neck: General: Supple without adenopathy or carotid bruits. Thyroid: Thyroid size normal.  No goiter or nodules appreciated.   Lungs: Clear with good BS bilat with no rales, rhonchi, or wheezes  Heart: RRR with normal S1 and S2 and no gallops; no murmurs; no rub  Abdomen: Normoactive bowel sounds, soft, nontender, without masses or organomegaly palpable  Extremities:  Lower extremities - No pretibial edema. No lesions.  Neuro: MS is good with appropriate affect, pt is alert and Ox3    DM foot exam: 09/01/2021  The skin of the feet is intact without sores or ulcerations. The pedal pulses are 2+ on  right and 2+ on left. The sensation is intact to a screening 5.07, 10 gram monofilament bilaterally   DATA REVIEWED:  Lab Results  Component Value Date   HGBA1C >15.0 09/01/2021   HGBA1C >14.0 (H) 01/10/2021   HGBA1C >15 11/19/2017   Lab Results  Component Value Date   MICROALBUR <0.7 01/10/2021   LDLCALC 63 01/10/2021   CREATININE 0.85 01/10/2021   Lab Results  Component Value Date   MICRALBCREAT 4.8 01/10/2021    Lab Results  Component Value Date  CHOL 142 01/10/2021   HDL 53.40 01/10/2021   LDLCALC 63 01/10/2021   TRIG 125.0 01/10/2021   CHOLHDL 3 01/10/2021        ASSESSMENT / PLAN / RECOMMENDATIONS:   1) Type 1 Diabetes Mellitus, poorly controlled, Without complications - Most recent A1c of 14.0 %. Goal A1c < 7.0 %.    Plan: GENERAL: I have discussed with the patient the pathophysiology of diabetes. We went over the natural progression of the disease. I explained the complications associated with diabetes including retinopathy, nephropathy, neuropathy as well as increased risk of cardiovascular disease. We went over the benefit seen with glycemic control.   I explained to the patient that diabetic patients are at higher than normal risk for amputations.  Discussed pharmacokinetics of basal/bolus insulin and the importance of taking prandial insulin with meals.  We also discussed avoiding sugar-sweetened beverages and snacks, when possible.  Will start basal insuilin  She was given a sample of Dexcom G7, and new prescription has been sent She was on insulin pump in high school but insurance stopped paying for it , we consider this in the future She will also be provided with a correction scale, she was advised to use it for Premeal glucose  MEDICATIONS: Start Lantus 20 units daily  Decrease Novolog 20 units TIDQAC Start Correction Scale :Novolog  (BG-130/40)   EDUCATION / INSTRUCTIONS: BG monitoring instructions: Patient is instructed to check her blood  sugars 3 times a day, before meals. Call [place] [facility] clinic if: BG persistently < 70  I reviewed the Rule of 15 for the treatment of hypoglycemia in detail with the patient. Literature supplied.   2) Diabetic complications:  Eye: Does not have known diabetic retinopathy.  Neuro/ Feet: Does not have known diabetic peripheral neuropathy. Renal: Patient does not have known baseline CKD. She is not on an ACEI/ARB at present.       Signed electronically by: Lyndle Herrlich, MD  [facility] End[facility]  [facility] [address], [address] [place], [place] [address] Phone: [phone] FAX: [phone]   CC: Sharlene Dory, DO [address] [place] [address] Phone: [phone]  Fax: [phone]    Return to Endocrinology clinic as below: Future Appointments  Date Time Provider Department Center  01/14/2022 12:45 PM Sharlene Dory, DO [facility] [place]  01/23/2022  1:00 PM Yovanny Coats, Konrad Dolores, MD [facility] None

## 2021-09-01 NOTE — Patient Instructions (Addendum)
Start Lantus 20 units daily  Novolog 20 units with each meal  Novolog correctional insulin: ADD extra units on insulin to your meal-time Novolog dose if your blood sugars are higher than 170. Use the scale below to help guide you:   Blood sugar before meal Number of units to inject  Less than 170 0 unit  171 -  210 1 units  211 -  250 2 units  251 -  290 3 units  291 -  330 4 units  331 -  370 5 units  371 -  410 6 units  411 -  450 7 units  451 -  490 8 units  491 - 530 9 units    HOW TO TREAT LOW BLOOD SUGARS (Blood sugar LESS THAN 70 MG/DL) Please follow the RULE OF 15 for the treatment of hypoglycemia treatment (when your (blood sugars are less than 70 mg/dL)   STEP 1: Take 15 grams of carbohydrates when your blood sugar is low, which includes:  3-4 GLUCOSE TABS  OR 3-4 OZ OF JUICE OR REGULAR SODA OR ONE TUBE OF GLUCOSE GEL    STEP 2: RECHECK blood sugar in 15 MINUTES STEP 3: If your blood sugar is still low at the 15 minute recheck --> then, go back to STEP 1 and treat AGAIN with another 15 grams of carbohydrates.

## 2021-09-08 ENCOUNTER — Telehealth: Payer: Self-pay

## 2021-09-08 MED ORDER — BASAGLAR TEMPO PEN 100 UNIT/ML ~~LOC~~ SOPN: 20.0000 [IU] | PEN_INJECTOR | Freq: Every day | SUBCUTANEOUS | 3 refills | Status: DC

## 2021-09-08 NOTE — Telephone Encounter (Signed)
Request from [facility] that Lantus must be changed to Levemir or Basaglar.

## 2021-09-10 ENCOUNTER — Telehealth: Payer: Self-pay

## 2021-09-10 ENCOUNTER — Other Ambulatory Visit (HOSPITAL_COMMUNITY): Payer: Self-pay

## 2021-09-10 NOTE — Telephone Encounter (Signed)
Patient Advocate Encounter   Received notification from [employer] that prior authorization is required for [facility] G7 Sensor  Submitted: 09-10-2021 Key [id number]

## 2021-09-11 ENCOUNTER — Other Ambulatory Visit (HOSPITAL_COMMUNITY): Payer: Self-pay

## 2021-09-11 ENCOUNTER — Other Ambulatory Visit: Payer: Self-pay

## 2021-09-11 ENCOUNTER — Encounter: Payer: Self-pay | Admitting: Internal Medicine

## 2021-09-11 MED ORDER — DEXCOM G7 SENSOR MISC: 1.0000 | 3 refills | Status: DC

## 2021-09-11 NOTE — Telephone Encounter (Signed)
Patient Advocate Encounter  Received a fax from [facility] regarding Prior Authorization for Dexcom G7.   Authorization has been DENIED due to non-formulary product. Test claims for this patient plan indicate that primary insurance does not cover any CGM products. Submitting PA to secondary insurance to see if approval can be obtained via [employer].  Submitted: 09/11/2021 Key: [id number]  Burnell Blanks, CPhT Rx Patient Advocate Phone: [phone]

## 2021-09-12 ENCOUNTER — Telehealth: Payer: Self-pay

## 2021-09-12 ENCOUNTER — Other Ambulatory Visit (HOSPITAL_COMMUNITY): Payer: Self-pay

## 2021-09-12 NOTE — Telephone Encounter (Signed)
Patient Advocate Encounter   Received notification from [facility] that prior authorization is required for [employer].  Submitted: 09/12/2021 Key [id number]   Burnell Blanks, CPhT Rx Patient Advocate Phone: [phone]

## 2021-09-12 NOTE — Telephone Encounter (Signed)
Patient Advocate Encounter  Prior Authorization for [employer] has been approved.   Effective: 09/12/2021 to 09/13/2022. Determination letter added to chart.  Burnell Blanks, CPhT Rx Patient Advocate Phone: [phone]

## 2021-09-12 NOTE — Telephone Encounter (Signed)
Patient Advocate Encounter  Received a fax from [employer] regarding Prior Authorization for [employer].   Authorization has been DENIED due to alternate RX benefits. This plan will not cover this product unless the patients primary insurance returns a paid claim.  Determination letter added to patient chart.  Burnell Blanks, CPhT Rx Patient Advocate Phone: [phone]

## 2021-09-16 ENCOUNTER — Other Ambulatory Visit (HOSPITAL_COMMUNITY): Payer: Self-pay

## 2021-09-16 NOTE — Telephone Encounter (Signed)
Does it not let you know which system is covered

## 2021-09-16 NOTE — Telephone Encounter (Deleted)
Patient Advocate Encounter  Prior Authorization for [employer] has been approved.   Effective: 09/12/2021 to 09/13/2022.  Burnell Blanks, CPhT Rx Patient Advocate Phone: [phone]

## 2021-09-29 ENCOUNTER — Other Ambulatory Visit: Payer: Self-pay | Admitting: Family Medicine

## 2021-09-29 DIAGNOSIS — G43109 Migraine with aura, not intractable, without status migrainosus: Secondary | ICD-10-CM

## 2021-10-17 ENCOUNTER — Encounter: Payer: Self-pay | Admitting: Neurology

## 2021-10-17 ENCOUNTER — Encounter: Payer: Self-pay | Admitting: Family Medicine

## 2021-10-17 ENCOUNTER — Ambulatory Visit (INDEPENDENT_AMBULATORY_CARE_PROVIDER_SITE_OTHER): Payer: Managed Care, Other (non HMO) | Admitting: Family Medicine

## 2021-10-17 VITALS — BP 110/74 | HR 86 | Temp 97.5°F | Ht 70.0 in | Wt 162.4 lb

## 2021-10-17 DIAGNOSIS — M545 Low back pain, unspecified: Secondary | ICD-10-CM

## 2021-10-17 DIAGNOSIS — G43109 Migraine with aura, not intractable, without status migrainosus: Secondary | ICD-10-CM | POA: Diagnosis not present

## 2021-10-17 MED ORDER — NURTEC 75 MG PO TBDP: ORAL_TABLET | ORAL | 0 refills | Status: DC

## 2021-10-17 MED ORDER — KETOROLAC TROMETHAMINE 60 MG/2ML IM SOLN
60.0000 mg | Freq: Once | INTRAMUSCULAR | Status: AC
  Administered 2021-10-17: 60 mg via INTRAMUSCULAR

## 2021-10-17 MED ORDER — TIZANIDINE HCL 4 MG PO TABS: 4.0000 mg | ORAL_TABLET | Freq: Four times a day (QID) | ORAL | 0 refills | Status: DC | PRN

## 2021-10-17 MED ORDER — NORGESTIMATE-ETH ESTRADIOL 0.25-35 MG-MCG PO TABS: 1.0000 | ORAL_TABLET | Freq: Every day | ORAL | 11 refills | Status: DC

## 2021-10-17 NOTE — Patient Instructions (Signed)

## 2021-10-17 NOTE — Progress Notes (Signed)
Musculoskeletal Exam  Patient: Tina Fry DOB: 2000-03-27  DOS: 10/17/2021  SUBJECTIVE:  Chief Complaint:   Chief Complaint  Patient presents with   Follow-up    Back pain Migraines     Tina Fry is a 21 y.o.  female for evaluation and treatment of back pain.   Onset:  2 weeks ago. No inj or change in activity.  Location: lower Character:  aching  Progression of issue:  has worsened Associated symptoms: radiating up back and into neck Denies bowel/bladder incontinence or weakness Treatment: to date has been OTC NSAIDS.   Neurovascular symptoms: no  Migraines Patient has a history of migraines.  She was taking Topamax 50 mg twice daily which was initially helpful but not currently.  Over the past 2 weeks, she has been having frequent migraines and is currently has 1 that has been lasting for 3 days.  No trauma to her head.  She was compliant with her medication.  She has failed propranolol, amitriptyline, and now Topamax.  She has never seen a headache specialist before.  For abortive therapy, Roselyn Meier was not working most recently.  She has also been on Imitrex and Maxalt which were not helpful.  Past Medical History:  Diagnosis Date   Depression    Diabetes mellitus type I ([place])    Follows with Endo   Hypertension     Objective:  VITAL SIGNS: BP 110/74 (BP Location: Left Arm, Patient Position: Sitting, Cuff Size: Normal)   Pulse 86   Temp (!) 97.5 F (36.4 C) (Oral)   Ht 5\' 10"  (1.778 m)   Wt 162 lb 6 oz (73.7 kg)   SpO2 99%   BMI 23.30 kg/m  Constitutional: Well formed, well developed. No acute distress. HENT: Normocephalic, atraumatic.  Thorax & Lungs:  No accessory muscle use Musculoskeletal: low back.   Tenderness to palpation: Mild tenderness over the right lumbar paraspinal musculature Deformity: no Ecchymosis: no Straight leg test: negative for Poor hamstring flexibility b/l. No TTP over the TMJ, temporalis, suboccipital triangle, or  cervical paraspinal musculature Neurologic: Normal sensory function. No focal deficits noted. DTR's equal and symmetric throughout, 5/5 strength throughout. No clonus. Psychiatric: Normal mood. Age appropriate judgment and insight. Alert & oriented x 3.    Assessment:  Acute right-sided low back pain without sciatica - Plan: tiZANidine (ZANAFLEX) 4 MG tablet, ketorolac (TORADOL) injection 60 mg  Migraine with aura and without status migrainosus, not intractable - Plan: Ambulatory referral to Neurology, ketorolac (TORADOL) injection 60 mg  Plan: 1.  Acute, hopefully self-limited.  Stretches/exercises, heat, ice, Tylenol, NSAIDs.  Trial Zanaflex.  If no improvement, will refer to physical therapy. 2.  Chronic, uncontrolled.  Trial Nurtec 75 mg every other day, refer to neurology.  If not covered, will send in Depakote.  She is not pregnant. F/u in 1 month to recheck. The patient voiced understanding and agreement to the plan.   [place], DO 10/17/21  11:22 AM

## 2021-10-21 ENCOUNTER — Other Ambulatory Visit: Payer: Self-pay | Admitting: Family Medicine

## 2021-10-21 DIAGNOSIS — M545 Low back pain, unspecified: Secondary | ICD-10-CM

## 2021-10-30 ENCOUNTER — Other Ambulatory Visit: Payer: Self-pay | Admitting: Family Medicine

## 2021-10-30 DIAGNOSIS — M545 Low back pain, unspecified: Secondary | ICD-10-CM

## 2021-11-03 ENCOUNTER — Other Ambulatory Visit: Payer: Self-pay | Admitting: Family Medicine

## 2021-11-03 DIAGNOSIS — M545 Low back pain, unspecified: Secondary | ICD-10-CM

## 2021-11-12 ENCOUNTER — Encounter: Payer: Self-pay | Admitting: Family Medicine

## 2021-11-13 ENCOUNTER — Ambulatory Visit (INDEPENDENT_AMBULATORY_CARE_PROVIDER_SITE_OTHER): Payer: Managed Care, Other (non HMO) | Admitting: Medical

## 2021-11-13 ENCOUNTER — Ambulatory Visit (HOSPITAL_BASED_OUTPATIENT_CLINIC_OR_DEPARTMENT_OTHER)
Admission: RE | Admit: 2021-11-13 | Discharge: 2021-11-13 | Disposition: A | Discharge status: Home / Self Care | Payer: Managed Care, Other (non HMO) | Source: Ambulatory Visit | Attending: Medical | Admitting: Medical

## 2021-11-13 VITALS — BP 117/90 | HR 82 | Resp 18 | Ht 70.0 in | Wt 166.0 lb

## 2021-11-13 DIAGNOSIS — M25562 Pain in left knee: Secondary | ICD-10-CM | POA: Diagnosis present

## 2021-11-13 DIAGNOSIS — L089 Local infection of the skin and subcutaneous tissue, unspecified: Secondary | ICD-10-CM | POA: Diagnosis not present

## 2021-11-13 MED ORDER — MUPIROCIN 2 % EX OINT: 1.0000 | TOPICAL_OINTMENT | Freq: Two times a day (BID) | CUTANEOUS | 0 refills | Status: DC

## 2021-11-13 MED ORDER — SULFAMETHOXAZOLE-TRIMETHOPRIM 800-160 MG PO TABS: 1.0000 | ORAL_TABLET | Freq: Two times a day (BID) | ORAL | 0 refills | Status: DC

## 2021-11-13 NOTE — Progress Notes (Signed)
   Subjective:    Patient ID: Tina Fry, female    DOB: March 14, 2000, 21 y.o.   MRN: [id number]  HPI Pt in for recent left knee skin irritation. Scratched knee with something at work about 10 days ago. Describes scape intially but had blister over area next day. Blister was present for week then 31 year old son kicked area and burst blister this past saturday. 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   Lmp- 11-03-2021.  Review of Systems  Constitutional:  Negative for chills, fatigue and fever.  Respiratory:  Negative for cough, chest tightness and shortness of breath.   Cardiovascular:  Negative for chest pain and palpitations.  Gastrointestinal:  Negative for abdominal pain.  Musculoskeletal:        Knee pain. Over tibial tuberosity.       Objective:   Physical Exam  General- No acute distress. Pleasant patient.  Left knee- no swelling, no redness or instability. But decreased range of motion due to tibia tuberosity pain. 1 cm circular broken down are of skin where described blister was present. No dc.          Assessment & Plan:   Patient Instructions  Left knee pain over tibial tuberosity region with decrease range of motion due to pain. Skin wound over patellar tendon region. Will get xray to look at area. Also get wound culture of area. While await culture start mupirocin topical twice daily and bactrim DS twice daily.   Off work until Sunday. Let me know if you need further days off.  Watch area for color change, dc or swelling. If occurs while under treatment let me know.  Follow up 7 days or sooner if needed.   Mackie Pai, PA-C

## 2021-11-13 NOTE — Patient Instructions (Addendum)
Left knee pain over tibial tuberosity region with decrease range of motion due to pain. Skin wound over patellar tendon region. Will get xray to look at area. Also get wound culture of area. While await culture start mupirocin topical twice daily and bactrim DS twice daily.   Off work until Sunday. Let me know if you need further days off.  Watch area for color change, dc or swelling. If occurs while under treatment let me know.  Follow up 7 days or sooner if needed.

## 2021-11-16 LAB — WOUND CULTURE
MICRO NUMBER:: 14135691
SPECIMEN QUALITY:: ADEQUATE

## 2021-11-17 ENCOUNTER — Encounter: Payer: Self-pay | Admitting: Medical

## 2021-11-18 ENCOUNTER — Encounter: Payer: Self-pay | Admitting: Family Medicine

## 2021-11-18 ENCOUNTER — Ambulatory Visit (INDEPENDENT_AMBULATORY_CARE_PROVIDER_SITE_OTHER): Payer: Managed Care, Other (non HMO) | Admitting: Family Medicine

## 2021-11-18 VITALS — BP 108/62 | HR 112 | Temp 98.9°F | Ht 70.0 in | Wt 166.2 lb

## 2021-11-18 DIAGNOSIS — S81002A Unspecified open wound, left knee, initial encounter: Secondary | ICD-10-CM

## 2021-11-18 MED ORDER — TRAMADOL HCL 50 MG PO TABS: 50.0000 mg | ORAL_TABLET | Freq: Three times a day (TID) | ORAL | 0 refills | Status: AC | PRN

## 2021-11-18 MED ORDER — MELOXICAM 15 MG PO TABS: 15.0000 mg | ORAL_TABLET | Freq: Every day | ORAL | 0 refills | Status: DC

## 2021-11-18 NOTE — Patient Instructions (Addendum)
When you do wash it, use only soap and water. Do not vigorously scrub. Finish the Bactrim. Keep the area clean and dry.   Things to look out for: increasing pain not relieved by Mobic/acetaminophen, fevers, spreading redness, drainage of pus, or foul odor.  Do not drink alcohol, do any illicit/street drugs, drive or do anything that requires alertness while on this medicine.   Ice/cold pack over area for 10-15 min twice daily.  OK to take Tylenol 1000 mg (2 extra strength tabs) or 975 mg (3 regular strength tabs) every 6 hours as needed.  Let us know if you need anything.

## 2021-11-18 NOTE — Progress Notes (Signed)
Chief Complaint  Patient presents with   skin infection    Tina Fry is a 21 y.o. female here for a skin complaint.  Duration: 2 weeks Location: L knee Pruritic? No Painful? Yes Drainage? No Scraped on a screw and it blistered, once it opened it started to swell and cause pain.  Other associated symptoms: no fevers or redness Therapies tried thus far: Bactrim, mupirocin, Tylenol, ibuprofen  Past Medical History:  Diagnosis Date   Depression    Diabetes mellitus type I ([place])    Follows with Endo   Hypertension     BP 108/62 (BP Location: Left Arm, Patient Position: Sitting, Cuff Size: Normal)   Pulse (!) 112   Temp 98.9 F (37.2 C) (Oral)   Ht 5\' 10"  (1.778 m)   Wt 166 lb 4 oz (75.4 kg)   LMP 10/22/2021   SpO2 99%   BMI 23.85 kg/m  Gen: awake, alert, appearing stated age Lungs: No accessory muscle use Skin: See below.  No excessive warmth but there is TTP over the area with some soft tissue swelling.  No drainage, erythema, fluctuance Psych: Age appropriate judgment and insight   Left knee  Open wound of left knee, initial encounter - Plan: meloxicam (MOBIC) 15 MG tablet, traMADol (ULTRAM) 50 MG tablet  Seems to be more inflammation due to a wound rather than an infection.  Culture reviewed, continue Bactrim until completion.  Okay to use Vaseline as needed, probably does not need mupirocin.  Ice recommended.  Meloxicam daily, Tylenol as needed.  Tramadol for breakthrough pain.  Warned about drowsiness associated with this.  Send a message in a couple weeks if not improved, sugars have not been ideally controlled per patient.  Would need to refer to the wound care team. F/u prn. The patient voiced understanding and agreement to the plan.  [place], DO 11/18/21 4:27 PM

## 2021-12-11 ENCOUNTER — Telehealth: Payer: Self-pay

## 2021-12-11 NOTE — Telephone Encounter (Signed)
Transition Care Management Follow-up Telephone Call Date of discharge and from where [facility] ED 12/10/2021 How have you been since you were released from the hospital? better Any questions or concerns? No  Items Reviewed: Did the pt receive and understand the discharge instructions provided? Yes  Medications obtained and verified? Yes  Other? No  Any new allergies since your discharge? No  Dietary orders reviewed? Yes Do you have support at home? Yes   Home Care and Equipment/Supplies: Were home health services ordered? no If so, what is the name of the agency? N/a  Has the agency set up a time to come to the patient's home? not applicable Were any new equipment or medical supplies ordered?  No What is the name of the medical supply agency? N/a Were you able to get the supplies/equipment? no Do you have any questions related to the use of the equipment or supplies? No  Functional Questionnaire: (I = Independent and D = Dependent) ADLs: I  Bathing/Dressing- I  Meal Prep- I  Eating- I  Maintaining continence- I  Transferring/Ambulation- I  Managing Meds- I  Follow up appointments reviewed:  PCP Hospital f/u appt confirmed? Yes  Scheduled to see Dr Carmelia Roller on 12/15/2021 @ 11:15. Specialist Hospital f/u appt confirmed? No   Are transportation arrangements needed? No  If their condition worsens, is the pt aware to call PCP or go to the Emergency Dept.? Yes Was the patient provided with contact information for the PCP's office or ED? Yes Was to pt encouraged to call back with questions or concerns? Yes Karena Addison, LPN [facility] Nurse Health Advisor Direct Dial [phone]

## 2021-12-15 ENCOUNTER — Encounter: Payer: Self-pay | Admitting: Family Medicine

## 2021-12-15 ENCOUNTER — Ambulatory Visit (INDEPENDENT_AMBULATORY_CARE_PROVIDER_SITE_OTHER): Payer: Managed Care, Other (non HMO) | Admitting: Family Medicine

## 2021-12-15 VITALS — BP 112/80 | HR 87 | Temp 98.4°F | Ht 70.0 in | Wt 165.4 lb

## 2021-12-15 DIAGNOSIS — N3001 Acute cystitis with hematuria: Secondary | ICD-10-CM | POA: Diagnosis not present

## 2021-12-15 NOTE — Progress Notes (Signed)
Chief Complaint  Patient presents with   Hospitalization Follow-up    Went to ED for Abdominal pain.    Subjective: Patient is a 21 y.o. female here for ED f/u.  The patient went to the ER on 11/29 for abdominal pain.  She was diagnosed with a urinary tract infection and placed on 12 days of cephalexin 500 mg 4 times daily.  She reports compliance with medication but wonders if she really needs to be on it for 12 full days.  She has slight nausea which ondansetron has been helping.  She continues to drink lots of cranberry juice.  Her symptoms overall are improved.  Past Medical History:  Diagnosis Date   Depression    Diabetes mellitus type I ([facility])    Follows with Endo   Hypertension     Objective: BP 112/80 (BP Location: Left Arm, Patient Position: Sitting, Cuff Size: Normal)   Pulse 87   Temp 98.4 F (36.9 C) (Oral)   Ht 5\' 10"  (1.778 m)   Wt 165 lb 6 oz (75 kg)   SpO2 99%   BMI 23.73 kg/m  General: Awake, appears stated age Heart: RRR Lungs: CTAB, no rales, wheezes or rhonchi. No accessory muscle use MSK: No CVA TTP Abdominal: Bowel sounds present, soft, nontender, nondistended Psych: Age appropriate judgment and insight, normal affect and mood  Assessment and Plan: Acute cystitis with hematuria  Improving issue, okay to stop at 7 days of Keflex given improvement.  Take a probiotic to help with GI symptoms from the medicine.  Okay to wean back on cranberry juice.  Tylenol as needed for pain/discomfort.  Continue to push fluids. The patient voiced understanding and agreement to the plan.  [place], DO 12/15/21  12:01 PM

## 2021-12-15 NOTE — Patient Instructions (Signed)
I would complete 7 days of the cephalexin.  Ok to take a probiotic or increase your yogurt intake while on the antibiotic.  Stay hydrated, we can back off on the cranberry juice.  Let us know if you need anything.

## 2022-01-14 ENCOUNTER — Telehealth: Payer: Self-pay | Admitting: Internal Medicine

## 2022-01-14 ENCOUNTER — Ambulatory Visit (INDEPENDENT_AMBULATORY_CARE_PROVIDER_SITE_OTHER): Payer: Managed Care, Other (non HMO) | Admitting: Family Medicine

## 2022-01-14 ENCOUNTER — Encounter: Payer: Self-pay | Admitting: Family Medicine

## 2022-01-14 VITALS — BP 118/64 | HR 91 | Temp 98.2°F | Ht 70.0 in | Wt 164.2 lb

## 2022-01-14 DIAGNOSIS — E1065 Type 1 diabetes mellitus with hyperglycemia: Secondary | ICD-10-CM | POA: Diagnosis not present

## 2022-01-14 DIAGNOSIS — Z0000 Encounter for general adult medical examination without abnormal findings: Secondary | ICD-10-CM | POA: Diagnosis not present

## 2022-01-14 LAB — COMPREHENSIVE METABOLIC PANEL
ALT: 8 U/L (ref 0–35)
AST: 8 U/L (ref 0–37)
Albumin: 4.2 g/dL (ref 3.5–5.2)
Alkaline Phosphatase: 62 U/L (ref 39–117)
BUN: 12 mg/dL (ref 6–23)
CO2: 25 mEq/L (ref 19–32)
Calcium: 9.9 mg/dL (ref 8.4–10.5)
Chloride: 96 mEq/L (ref 96–112)
Creatinine, Ser: 0.8 mg/dL (ref 0.40–1.20)
GFR: 104.83 mL/min (ref >60.00)
Glucose, Bld: 540 mg/dL (ref 70–99)
Potassium: 4.3 mEq/L (ref 3.5–5.1)
Sodium: 132 mEq/L — ABNORMAL LOW (ref 135–145)
Total Bilirubin: 0.4 mg/dL (ref 0.2–1.2)
Total Protein: 6.7 g/dL (ref 6.0–8.3)

## 2022-01-14 LAB — CBC
HCT: 38.7 % (ref 36.0–46.0)
Hemoglobin: 12.6 g/dL (ref 12.0–15.0)
MCHC: 32.6 g/dL (ref 30.0–36.0)
MCV: 82.8 fl (ref 78.0–100.0)
Platelets: 201 10*3/uL (ref 150.0–400.0)
RBC: 4.68 Mil/uL (ref 3.87–5.11)
RDW: 13.6 % (ref 11.5–15.5)
WBC: 4.3 10*3/uL (ref 4.0–10.5)

## 2022-01-14 LAB — MICROALBUMIN / CREATININE URINE RATIO
Creatinine,U: 17.5 mg/dL
Microalb Creat Ratio: 4 mg/g (ref 0.0–30.0)
Microalb, Ur: <0.7 mg/dL (ref 0.0–1.9)

## 2022-01-14 LAB — LIPID PANEL
Cholesterol: 134 mg/dL (ref 0–200)
HDL: 37.9 mg/dL — ABNORMAL LOW (ref >39.00)
LDL Cholesterol: 58 mg/dL (ref 0–99)
NonHDL: 96.21
Total CHOL/HDL Ratio: 4
Triglycerides: 189 mg/dL — ABNORMAL HIGH (ref 0.0–149.0)
VLDL: 37.8 mg/dL (ref 0.0–40.0)

## 2022-01-14 MED ORDER — NAPROXEN 500 MG PO TABS: 500.0000 mg | ORAL_TABLET | Freq: Two times a day (BID) | ORAL | 2 refills | Status: DC

## 2022-01-14 NOTE — Telephone Encounter (Signed)
Received a phone call from nurse on call, critical results noted, blood sugar was 540 today at around 1 PM. Reportedly she feels well.  She has no glucometer. On chart review hyperglycemia has been a chronic issue. Plan: Nurse will call the patient, recommend to be sure she got Basaglar 20 units today and to get NovoLog 20 units now. Recommend to call PCP in the morning

## 2022-01-14 NOTE — Progress Notes (Signed)
Chief Complaint  Patient presents with   Annual Exam     Well Woman Tina Fry is here for a complete physical.   Her last physical was >1 year ago.  Current diet: in general, diet is fair.  Current exercise: active at work. Fatigue out of ordinary? No Seatbelt? Yes Advanced directive? No  Health Maintenance Pap/HPV- Yes Tetanus- Yes HIV screening- Yes Hep C screening- Yes  Past Medical History:  Diagnosis Date   Depression    Diabetes mellitus type I ([place])    Follows with Endo   Hypertension      Past Surgical History:  Procedure Laterality Date   NO PAST SURGERIES      Medications  Current Outpatient Medications on File Prior to Visit  Medication Sig Dispense Refill   Blood Glucose Monitoring Suppl (GLUCOCOM BLOOD GLUCOSE MONITOR) DEVI 1 Device by Misc.(Non-Drug; Combo Route) route 3 times daily.     Continuous Blood Gluc Sensor (DEXCOM G7 SENSOR) MISC 1 Device by Does not apply route as directed. 9 each 3   glucagon 1 MG injection Inject 1 mg into the vein once as needed.     glucose blood (ONETOUCH VERIO) test strip 1 each by Other route 4 (four) times daily. And lancets 4/day 100 each 12   Insulin Glargine w/ Trans Port (BASAGLAR TEMPO PEN) 100 UNIT/ML SOPN Inject 20 Units into the skin daily in the afternoon. 30 mL 3   Insulin Pen Needle 32G X 4 MM MISC 1 Device by Does not apply route in the morning, at noon, in the evening, and at bedtime. 400 each 3   meloxicam (MOBIC) 15 MG tablet Take 1 tablet (15 mg total) by mouth daily. 30 tablet 0   norgestimate-ethinyl estradiol (SPRINTEC 28) 0.25-35 MG-MCG tablet Take 1 tablet by mouth daily. 28 tablet 11   NOVOLOG FLEXPEN 100 UNIT/ML FlexPen Max daily 80 units 60 mL 3   ondansetron (ZOFRAN-ODT) 4 MG disintegrating tablet Take by mouth.     Rimegepant Sulfate (NURTEC) 75 MG TBDP Dissolve one tab under tongue every other day. 16 tablet 0   terbinafine (LAMISIL) 250 MG tablet Take 1 tablet (250 mg total) by mouth  daily. 90 tablet 0   No current facility-administered medications on file prior to visit.     Allergies No Known Allergies  Review of Systems: Constitutional:  no unexpected weight changes Eye:  no recent significant change in vision Ear/Nose/Mouth/Throat:  Ears:  no tinnitus or vertigo and no recent change in hearing Nose/Mouth/Throat:  no complaints of nasal congestion, no sore throat Cardiovascular: no chest pain Respiratory:  no cough and no shortness of breath Gastrointestinal:  no abdominal pain, no change in bowel habits GU:  Female: negative for dysuria or pelvic pain Musculoskeletal/Extremities:  no pain of the joints Integumentary (Skin/Breast):  no abnormal skin lesions reported Neurologic:  no headaches Endocrine:  denies fatigue Hematologic/Lymphatic:  No areas of easy bleeding  Exam BP 118/64 (BP Location: Left Arm, Patient Position: Sitting, Cuff Size: Normal)   Pulse 91   Temp 98.2 F (36.8 C) (Oral)   Ht 5\' 10"  (1.778 m)   Wt 164 lb 4 oz (74.5 kg)   SpO2 98%   BMI 23.57 kg/m  General:  well developed, well nourished, in no apparent distress Skin:  no significant moles, warts, or growths Head:  no masses, lesions, or tenderness Eyes:  pupils equal and round, sclera anicteric without injection Ears:  canals without lesions, TMs shiny without retraction,  no obvious effusion, no erythema Nose:  nares patent, mucosa normal, and no drainage  Throat/Pharynx:  lips and gingiva without lesion; tongue and uvula midline; non-inflamed pharynx; no exudates or postnasal drainage Neck: neck supple without adenopathy, thyromegaly, or masses Lungs:  clear to auscultation, breath sounds equal bilaterally, no respiratory distress Cardio:  regular rate and rhythm, no bruits, no LE edema Abdomen:  abdomen soft, nontender; bowel sounds normal; no masses or organomegaly Genital: Defer to GYN Musculoskeletal:  symmetrical muscle groups noted without atrophy or  deformity Extremities:  no clubbing, cyanosis, or edema, no deformities, no skin discoloration Neuro:  gait normal; deep tendon reflexes normal and symmetric Psych: well oriented with normal range of affect and appropriate judgment/insight  Assessment and Plan  Well adult exam - Plan: Lipid panel, CBC, Comprehensive metabolic panel  Type 1 diabetes mellitus with hyperglycemia (HCC) - Plan: Microalbumin / creatinine urine ratio   Well 22 y.o. female. Counseled on diet and exercise. Advanced directive form provided today.  Needs to sched eye exam.  Other orders as above. Follow up in 6 mo. The patient voiced understanding and agreement to the plan.  [place], DO 01/14/22 12:44 PM

## 2022-01-14 NOTE — Patient Instructions (Addendum)
Give [place] 2-3 business days to get the results of your labs back.   Keep the diet clean and stay active.  Please schedule your eye exam.   Please get me a copy of your advanced directive form at your convenience.   Let us know if you need anything.

## 2022-01-15 NOTE — Telephone Encounter (Signed)
PCP already aware.

## 2022-01-15 NOTE — Telephone Encounter (Signed)
Make sure she keeps her appt with endo next Friday please.

## 2022-01-15 NOTE — Telephone Encounter (Signed)
Initial Comment Caller states he is calling from [facility] with results on a pt. of Dr. Nani Ravens. He states they are critical results. Translation No Nurse Assessment Nurse: Laurance Flatten, RN, Geni Bers Date/Time (Eastern Time): 01/14/2022 5:16:19 PM Is there an on-call provider listed? ---Yes Please list name of person reporting value (Lab Employee) and a contact number. ---Saa with [facility] @ [phone] Please document the following items: Lab name Lab value (read back to lab to verify) Reference range for lab value Date and time blood was drawn Collect time of birth for bilirubin results ---Glucose @ 540 Collected today (01/14/22) at 1256 Nurse: Laurance Flatten, RN, Geni Bers Date/Time (Eastern Time): 01/14/2022 5:22:43 PM Confirm and document reason for call. If symptomatic, describe symptoms. ---Spoke with patient and she stated she is diabetic but she does not have a glucose meter to check her blood sugar with. Caller stated only symptoms are thirsty and urinating a lot. Does the patient have any new or worsening symptoms? ---Yes Will a triage be completed? ---Yes Related visit to physician within the last 2 weeks? ---No Does the PT have any chronic conditions? (i.e. diabetes, asthma, this includes High risk factors for pregnancy, etc.) ---Yes List chronic conditions. ---Diabetes Is the patient pregnant or possibly pregnant? (Ask all females between the ages of 39-55) ---No PLEASE NOTE: All timestamps contained within this report are represented as [place] Standard Time. CONFIDENTIALTY NOTICE: This fax transmission is intended only for the addressee. It contains information that is legally privileged, confidential or otherwise protected from use or disclosure. If you are not the intended recipient, you are strictly prohibited from reviewing, disclosing, copying using or disseminating any of this information or taking any action in reliance on or regarding this information. If you have  received this fax in error, please notify us immediately by telephone so that we can arrange for its return to [place]. Phone: [phone], Toll-Free: [phone], Fax: [phone] Page: 2 of 3 Call Id: [id number] Nurse Assessment Is this a behavioral health or substance abuse call? ---No Guidelines Guideline Title Affirmed Question Affirmed Notes Nurse Date/Time Eilene Ghazi Time) Diabetes - High Blood Sugar [1] Symptoms of high blood sugar (e.g., abnormally thirsty, frequent urination, weight loss) AND [2] not able to test blood glucose Laurance Flatten, RN, Geni Bers 01/14/2022 5:25:12 PM Disp. Time Eilene Ghazi Time) Disposition Final User 01/14/2022 5:29:10 PM See PCP within 24 Hours Yes Clotilde Dieter 01/14/2022 5:29:33 PM Lab Call Laurance Flatten, RN, Geni Bers Reason: [facility] @ [phone] 01/14/2022 5:30:45 PM Paged On Call back to Call [place], [place] Final Disposition 01/14/2022 5:29:10 PM See PCP within 24 Hours Yes Laurance Flatten, RN, Althea Grimmer Disagree/Comply Comply Caller Understands Yes PreDisposition InappropriateToAsk Care Advice Given Per Guideline SEE PCP WITHIN 24 HOURS: * IF OFFICE WILL BE OPEN: You need to be examined within the next 24 hours. Call your doctor (or NP/PA) when the office opens and make an appointment. CALL BACK IF: * Vomiting occurs * Rapid breathing occurs * You become worse TREATMENT - LIQUIDS: * Drink at least one glass (8 oz; 240 ml) of water per hour for the next 4 hours. Reason: Adequate hydration will help lower blood sugar. * Try to drink 6 to 8 glasses of water each day. Comments User: Georgian Co, RN Date/Time Eilene Ghazi Time): 01/14/2022 5:46:07 PM Called patient back, as instructed by on call doctor, and instructed her to take 20 units of her Novolog now and continue to other insulin as instructed. Referrals REFERRED TO PCP OFFICE

## 2022-01-23 ENCOUNTER — Ambulatory Visit (INDEPENDENT_AMBULATORY_CARE_PROVIDER_SITE_OTHER): Payer: Managed Care, Other (non HMO) | Admitting: Internal Medicine

## 2022-01-23 ENCOUNTER — Encounter: Payer: Self-pay | Admitting: Internal Medicine

## 2022-01-23 VITALS — BP 124/86 | HR 99 | Ht 70.0 in | Wt 163.0 lb

## 2022-01-23 DIAGNOSIS — E1065 Type 1 diabetes mellitus with hyperglycemia: Secondary | ICD-10-CM | POA: Diagnosis not present

## 2022-01-23 LAB — POCT GLYCOSYLATED HEMOGLOBIN (HGB A1C): HbA1c POC (<> result, manual entry): >15 % (ref 4.0–5.6)

## 2022-01-23 LAB — POCT GLUCOSE (DEVICE FOR HOME USE): Glucose Fasting, POC: 329 mg/dL — AB (ref 70–99)

## 2022-01-23 MED ORDER — BASAGLAR KWIKPEN 100 UNIT/ML ~~LOC~~ SOPN: 24.0000 [IU] | PEN_INJECTOR | Freq: Every day | SUBCUTANEOUS | 3 refills | Status: DC

## 2022-01-23 MED ORDER — INSULIN PEN NEEDLE 32G X 4 MM MISC: 1.0000 | Freq: Four times a day (QID) | 3 refills | Status: DC

## 2022-01-23 MED ORDER — ACCU-CHEK GUIDE ME W/DEVICE KIT: 1.0000 | PACK | Freq: Every day | 0 refills | Status: DC

## 2022-01-23 MED ORDER — ACCU-CHEK GUIDE VI STRP: 1.0000 | ORAL_STRIP | Freq: Three times a day (TID) | 3 refills | Status: DC

## 2022-01-23 MED ORDER — FREESTYLE LIBRE 2 SENSOR MISC: 1.0000 | 3 refills | Status: DC

## 2022-01-23 NOTE — Patient Instructions (Addendum)
Please contact [employer] at [phone]    Increase Basaglar  24 units daily  Continue Novolog 20 units with each meal  Novolog correctional insulin: ADD extra units on insulin to your meal-time Novolog dose if your blood sugars are higher than 170. Use the scale below to help guide you:   Blood sugar before meal Number of units to inject  Less than 170 0 unit  171 -  210 1 units  211 -  250 2 units  251 -  290 3 units  291 -  330 4 units  331 -  370 5 units  371 -  410 6 units  411 -  450 7 units  451 -  490 8 units  491 - 530 9 units    HOW TO TREAT LOW BLOOD SUGARS (Blood sugar LESS THAN 70 MG/DL) Please follow the RULE OF 15 for the treatment of hypoglycemia treatment (when your (blood sugars are less than 70 mg/dL)   STEP 1: Take 15 grams of carbohydrates when your blood sugar is low, which includes:  3-4 GLUCOSE TABS  OR 3-4 OZ OF JUICE OR REGULAR SODA OR ONE TUBE OF GLUCOSE GEL    STEP 2: RECHECK blood sugar in 15 MINUTES STEP 3: If your blood sugar is still low at the 15 minute recheck --> then, go back to STEP 1 and treat AGAIN with another 15 grams of carbohydrates.

## 2022-01-23 NOTE — Progress Notes (Signed)
Name: Tina Fry  MRN/ DOB: [id number], 04/30/2000   Age/ Sex: 22 y.o., female    PCP: Sharlene Dory, DO   Reason for Endocrinology Evaluation: Type 1 Diabetes Mellitus     Date of Initial Endocrinology Visit: 09/01/2021    PATIENT IDENTIFIER: [name] is a 22 y.o. female with a past medical history of T1DM, migraine headaches. The patient presented for initial endocrinology clinic visit on 09/01/2021  for consultative assistance with her diabetes management.    HPI: [name] was    Diagnosed with DM at age 62  Prior Medications tried/Intolerance: Metformin-  no intolerance . Was on insulin pump in high school but stopped due to cost                 Hemoglobin A1c has ranged from 12.6% in 2015, peaking at >15.0% in 2019. Patient has required hospitalization: 12/2020. Last DKA 2016   S/P vaginal deliver 05/2019   On her initial visit to our clinic her A1c 14.0%, she was on Novolog only. We started basal insulin and provided her with correction scale as well    SUBJECTIVE:   During the last visit (09/01/2021): A1c 14.0%  Today (01/23/22): Tina Fry is here for follow-up on diabetes management.  She checks herblood sugars 0 times daily.    CGM cost prohibitive  She does not take basaglar   HOME DIABETES REGIMEN: Basaglar  20 units daily  Novolog 20 units TID QAC  CF: Novolog (BG-130/40)      Statin: no ACE-I/ARB: no    METER DOWNLOAD SUMMARY: did not bring     DIABETIC COMPLICATIONS: Microvascular complications:   Denies: CKD , retinopathy, neuropathy  Last eye exam: Completed 2022  Macrovascular complications:   Denies: CAD, PVD, CVA   PAST HISTORY: Past Medical History:  Past Medical History:  Diagnosis Date   Depression    Diabetes mellitus type I (HCC)    Follows with Endo   Hypertension    Past Surgical History:  Past Surgical History:  Procedure Laterality Date   NO PAST SURGERIES      Social History:   reports that she has never smoked. She has been exposed to tobacco smoke. She has never used smokeless tobacco. She reports that she does not drink alcohol and does not use drugs. Family History:  Family History  Problem Relation Age of Onset   Anxiety disorder Mother    Depression Mother    Gestational diabetes Mother    Drug abuse Father    Diabetes Mellitus I Maternal Aunt        Maternal great aunt   Diabetes Mellitus II Paternal Uncle    Diabetes Mellitus II Paternal Grandfather    ADD / ADHD Paternal Grandfather    Diabetes Paternal Grandfather    Drug abuse Paternal Grandfather    Lupus Maternal Grandmother    Hypertension Maternal Grandmother    Alcohol abuse Maternal Grandfather    Depression Maternal Grandfather    Drug abuse Maternal Grandfather    Mental illness Maternal Grandfather    Alcohol abuse Paternal Grandmother    Drug abuse Paternal Grandmother      HOME MEDICATIONS: Allergies as of 01/23/2022   No Known Allergies      Medication List        Accurate as of January 23, 2022  1:09 PM. If you have any questions, ask your nurse or doctor.  STOP taking these medications    Dexcom G7 Sensor Misc Stopped by: Dorita Sciara, MD   GlucoCom Blood Glucose Monitor Devi Stopped by: Dorita Sciara, MD   glucose blood test strip Commonly known as: OneTouch Verio Stopped by: Dorita Sciara, MD   norgestimate-ethinyl estradiol 0.25-35 MG-MCG tablet Commonly known as: Sprintec 28 Stopped by: Dorita Sciara, MD   Nurtec 75 MG Tbdp Generic drug: Rimegepant Sulfate Stopped by: Dorita Sciara, MD   ondansetron 4 MG disintegrating tablet Commonly known as: ZOFRAN-ODT Stopped by: Dorita Sciara, MD   terbinafine 250 MG tablet Commonly known as: LamISIL Stopped by: Dorita Sciara, MD       TAKE these medications    Basaglar Tempo Pen 100 UNIT/ML Sopn Generic drug: Insulin Glargine w/ Trans  Port Inject 20 Units into the skin daily in the afternoon.   glucagon 1 MG injection Inject 1 mg into the vein once as needed.   Insulin Pen Needle 32G X 4 MM Misc 1 Device by Does not apply route in the morning, at noon, in the evening, and at bedtime.   naproxen 500 MG tablet Commonly known as: NAPROSYN Take 1 tablet (500 mg total) by mouth 2 (two) times daily with a meal.   NovoLOG FlexPen 100 UNIT/ML FlexPen Generic drug: insulin aspart Max daily 80 units         ALLERGIES: No Known Allergies   REVIEW OF SYSTEMS: A comprehensive ROS was conducted with the patient and is negative except as per HPI    OBJECTIVE:   VITAL SIGNS: BP 124/86 (BP Location: Left Arm, Patient Position: Sitting, Cuff Size: Small)   Pulse 99   Ht 5\' 10"  (1.778 m)   Wt 163 lb 0.6 oz (74 kg)   SpO2 99%   BMI 23.39 kg/m    PHYSICAL EXAM:  General: Pt appears well and is in NAD  Neck: General: Supple without adenopathy or carotid bruits. Thyroid: Thyroid size normal.  No goiter or nodules appreciated.   Lungs: Clear with good BS bilat with no rales, rhonchi, or wheezes  Heart: RRR with normal S1 and S2 and no gallops; no murmurs; no rub  Abdomen: Normoactive bowel sounds, soft, nontender, without masses or organomegaly palpable  Extremities:  Lower extremities - No pretibial edema. No lesions.  Neuro: MS is good with appropriate affect, pt is alert and Ox3    DM foot exam: 09/01/2021  The skin of the feet is intact without sores or ulcerations. The pedal pulses are 2+ on right and 2+ on left. The sensation is intact to a screening 5.07, 10 gram monofilament bilaterally   DATA REVIEWED:  Lab Results  Component Value Date   HGBA1C >15.0 01/23/2022   HGBA1C >15.0 09/01/2021   HGBA1C >14.0 (H) 01/10/2021   Lab Results  Component Value Date   MICROALBUR <0.7 01/14/2022   LDLCALC 58 01/14/2022   CREATININE 0.80 01/14/2022   Lab Results  Component Value Date   MICRALBCREAT 4.0  01/14/2022    Lab Results  Component Value Date   CHOL 134 01/14/2022   HDL 37.90 (L) 01/14/2022   LDLCALC 58 01/14/2022   TRIG 189.0 (H) 01/14/2022   CHOLHDL 4 01/14/2022        ASSESSMENT / PLAN / RECOMMENDATIONS:   1) Type 1 Diabetes Mellitus, poorly controlled, Without complications - Most recent A1c of >14.0 %. Goal A1c < 7.0 %.    -Unfortunately the patient continues with hyperglycemia with no  change in A1c despite adding a basal insulin of 20 units am providing her with a correction scale, this is not surprising because the patient did not make any changes, as she continues not to take her basal insulin, she has not been checking glucose and not using correction scale -We again discussed the risk of blindness, ESRD, amputation with poorly controlled diabetes - Dexcom was cost prohibitive - Will prescribe freestyle libre  - A glucose meter was also sent with extra strips - She mainly forgets to take [place], he not taken it this week  - She is interested in pump technology, omnipod is>$500 through [employer], will try tandem , she was given the help line to start paper forms - She is fasting today, will obtain serum glucose and C-peptide     MEDICATIONS: Increase Basaglar 24 units daily  Continue  Novolog 20 units TIDQAC Start Correction Scale :Novolog  (BG-130/40)   EDUCATION / INSTRUCTIONS: BG monitoring instructions: Patient is instructed to check her blood sugars 3 times a day, before meals. Call [facility] if: BG persistently < 70  I reviewed the Rule of 15 for the treatment of hypoglycemia in detail with the patient. Literature supplied.   2) Diabetic complications:  Eye: Does not have known diabetic retinopathy.  Neuro/ Feet: Does not have known diabetic peripheral neuropathy. Renal: Patient does not have known baseline CKD. She is not on an ACEI/ARB at present.     Follow-up in 4 months  Signed electronically by: Mack Guise,  MD  [facility]  [facility] [address] [place], [place] [address] Phone: [phone] FAX: [phone]   CC: Shelda Pal, [address] [place] [address] Phone: [phone]  Fax: [phone]    Return to Endocrinology clinic as below: Future Appointments  Date Time Provider [place]  03/06/2022 11:10 AM Pieter Partridge, DO [facility] None

## 2022-01-24 LAB — C-PEPTIDE: C-Peptide: 0.89 ng/mL (ref 0.80–3.85)

## 2022-01-24 LAB — GLUCOSE, RANDOM: Glucose, Plasma: 131 mg/dL (ref 65–139)

## 2022-01-27 ENCOUNTER — Other Ambulatory Visit (HOSPITAL_COMMUNITY): Payer: Self-pay

## 2022-02-11 ENCOUNTER — Other Ambulatory Visit (HOSPITAL_COMMUNITY): Payer: Self-pay

## 2022-02-13 ENCOUNTER — Telehealth: Payer: Self-pay | Admitting: Family Medicine

## 2022-02-13 NOTE — Telephone Encounter (Signed)
Forms faxed into front office Placed in wendling bin up front

## 2022-02-16 NOTE — Telephone Encounter (Signed)
Form completed and faxed to [facility] at [phone] Case #[id number]. Employee ID #[id number] Received fax confirmation Patient informed Made a copy sent to scan Original in my folder

## 2022-02-16 NOTE — Telephone Encounter (Signed)
She said the one they should have sent you was how many days per month she needs She is not sure why you need a specific date If they sent the wrong paperwork will call her back and she can have them send the correct paperwork

## 2022-03-05 NOTE — Progress Notes (Signed)
NEUROLOGY CONSULTATION NOTE  Tina Fry MRN: [id number] DOB: 04-17-2000  Referring provider: Riki Sheer, DO Primary care provider: Riki Sheer, DO  Reason for consult:  migraine  Assessment/Plan:   Migraine without aura, with status migrainosus, intractable  As these migraines have gotten worse (more severe and intractable), will order MRI of brain with and without contrast Migraine prevention:  start Aimovig '140mg'$  every 28 days Migraine rescue:  start sumatriptan '6mg'$  [place] Limit use of pain relievers to no more than 2 days out of week to prevent risk of rebound or medication-overuse headache. Keep headache diary Follow up 4-5 months.    Subjective:  Tina Fry is a 22 year old female with type 1 diabetes and hypertension who presents for migraines.  History supplemented by referring provider's note.  Onset:  middle school.  They used to be manageable (mild, last 2-3 days, occur 2-3 times a month).  Worse over past 6 months and lasting longer.  Unsure why.  No new stressors or change in lifestyle.   Location:  bilateral temples, across forehead Quality:  throbbing, tight Intensity:  severe.   Aura:  absent Prodrome:  absent Associated symptoms:  Photophobia, phonophobia, blurred vision.  She denies associated nausea, vomiting, osmophobia, autonomic symptoms, unilateral numbness or weakness. Duration:  8-9 days Frequency:  twice a month (15-18 days a month) Frequency of abortive medication: 2 days a week (naproxen or ibuprofen '800mg'$ ) Triggers:  unknown.  Not associated with sleep disturbance, menstrual cycle. Relieving factors:  nothing Activity:  movement/activity aggravated  Past NSAIDS/analgesics:  ibuprofen, acetaminophen, Excedrin, tramadol Past abortive triptans:  sumatriptan tab, rizatriptan Past abortive ergotamine:  none Past muscle relaxants:  tizanidine Past anti-emetic:  ondansetron Past antihypertensive medications:  propranolol,  enalapril  Past antidepressant medications:  amitriptyline, fluoxetine Past anticonvulsant medications:  topiramate Past anti-CGRP:  Roselyn Meier, Nurtec Past vitamins/Herbal/Supplements:  none Past antihistamines/decongestants:  none Other past therapies:  none  Current NSAIDS/analgesics:  naproxen, ibuprofen '800mg'$  Current triptans:  none Current ergotamine:  none Current anti-emetic:  none Current muscle relaxants:  none Current Antihypertensive medications:  none Current Antidepressant medications:  none Current Anticonvulsant medications:  none Current anti-CGRP:  none Current Vitamins/Herbal/Supplements:  none Current Antihistamines/Decongestants:  none Other therapy:  none Birth control:  none Other medications:  Novolog, Insulin   Caffeine:  Frappichinos or Energy drinks infrequently. Diet:  100 oz water daily.  Skips meals.  Eats fast food. Exercise:  walking (2 miles daily) Depression:  yes; Anxiety:  yes Other pain: mild back aches.   Sleep hygiene:  varies.  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   Family history of headache:  no      PAST MEDICAL HISTORY: Past Medical History:  Diagnosis Date   Depression    Diabetes mellitus type I ([place])    Follows with Endo   Hypertension     PAST SURGICAL HISTORY: Past Surgical History:  Procedure Laterality Date   NO PAST SURGERIES      MEDICATIONS: Current Outpatient Medications on File Prior to Visit  Medication Sig Dispense Refill   Blood Glucose Monitoring Suppl (ACCU-CHEK GUIDE ME) w/Device KIT 1 Device by Does not apply route daily in the afternoon. 1 kit 0   Continuous Blood Gluc Sensor (FREESTYLE LIBRE 2 SENSOR) MISC 1 Device by Does not apply route every 14 (fourteen) days. 6 each 3   glucagon 1 MG injection Inject 1 mg into the vein once as needed. (  Patient not taking: Reported on 01/23/2022)     glucose blood (ACCU-CHEK GUIDE) test strip 1 each by Other route  3 (three) times daily. Use as instructed 300 each 3   Insulin Glargine (BASAGLAR KWIKPEN) 100 UNIT/ML Inject 24 Units into the skin daily. 30 mL 3   Insulin Pen Needle 32G X 4 MM MISC 1 Device by Does not apply route in the morning, at noon, in the evening, and at bedtime. 400 each 3   naproxen (NAPROSYN) 500 MG tablet Take 1 tablet (500 mg total) by mouth 2 (two) times daily with a meal. 30 tablet 2   NOVOLOG FLEXPEN 100 UNIT/ML FlexPen Max daily 80 units 60 mL 3   No current facility-administered medications on file prior to visit.    ALLERGIES: No Known Allergies  FAMILY HISTORY: Family History  Problem Relation Age of Onset   Anxiety disorder Mother    Depression Mother    Gestational diabetes Mother    Drug abuse Father    Diabetes Mellitus I Maternal Aunt        Maternal great aunt   Diabetes Mellitus II Paternal Uncle    Diabetes Mellitus II Paternal Grandfather    ADD / ADHD Paternal Grandfather    Diabetes Paternal Grandfather    Drug abuse Paternal Grandfather    Lupus Maternal Grandmother    Hypertension Maternal Grandmother    Alcohol abuse Maternal Grandfather    Depression Maternal Grandfather    Drug abuse Maternal Grandfather    Mental illness Maternal Grandfather    Alcohol abuse Paternal Grandmother    Drug abuse Paternal Grandmother     Objective:  Blood pressure 118/85, pulse (!) 104, height '5\' 10"'$  (1.778 m), weight 159 lb (72.1 kg), SpO2 100 %. General: No acute distress.  Patient appears well-groomed.   Head:  Normocephalic/atraumatic Eyes:  fundi examined but not visualized Neck: supple, no paraspinal tenderness, full range of motion Back: No paraspinal tenderness Heart: regular rate and rhythm Lungs: Clear to auscultation bilaterally. Vascular: No carotid bruits. Neurological Exam: Mental status: alert and oriented to person, place, and time, speech fluent and not dysarthric, language intact. Cranial nerves: CN I: not tested CN II: pupils  equal, round and reactive to light, visual fields intact CN III, IV, VI:  full range of motion, no nystagmus, no ptosis CN V: facial sensation intact. CN VII: upper and lower face symmetric CN VIII: hearing intact CN IX, X: gag intact, uvula midline CN XI: sternocleidomastoid and trapezius muscles intact CN XII: tongue midline Bulk & Tone: normal, no fasciculations. Motor:  muscle strength 5/5 throughout Sensation:  Pinprick, temperature and vibratory sensation intact. Deep Tendon Reflexes:  2+ throughout,  toes downgoing.   Finger to nose testing:  Without dysmetria.   Heel to shin:  Without dysmetria.   Gait:  Normal station and stride.  Romberg negative.    Thank you for allowing me to take part in the care of this patient.  Metta Clines, DO  CC: Riki Sheer, DO

## 2022-03-06 ENCOUNTER — Encounter: Payer: Self-pay | Admitting: Neurology

## 2022-03-06 ENCOUNTER — Ambulatory Visit (INDEPENDENT_AMBULATORY_CARE_PROVIDER_SITE_OTHER): Payer: Managed Care, Other (non HMO) | Admitting: Neurology

## 2022-03-06 VITALS — BP 118/85 | HR 104 | Ht 70.0 in | Wt 159.0 lb

## 2022-03-06 DIAGNOSIS — G43111 Migraine with aura, intractable, with status migrainosus: Secondary | ICD-10-CM | POA: Diagnosis not present

## 2022-03-06 MED ORDER — AIMOVIG 140 MG/ML ~~LOC~~ SOAJ: 140.0000 mg | SUBCUTANEOUS | 11 refills | Status: DC

## 2022-03-06 MED ORDER — SUMATRIPTAN SUCCINATE 6 MG/0.5ML ~~LOC~~ SOAJ: 6.0000 mg | SUBCUTANEOUS | 5 refills | Status: DC | PRN

## 2022-03-06 NOTE — Patient Instructions (Addendum)
  Check MRI of brain with and without contrast Start Aimovig 163m every 28 days.   Take sumatriptan shot at earliest onset of headache.  May repeat dose once in 1 hour if needed.  Maximum 2 shots in 24 hours. Limit use of pain relievers to no more than 2 days out of the week.  These medications include acetaminophen, NSAIDs (ibuprofen/Advil/Motrin, naproxen/Aleve, triptans (Imitrex/sumatriptan), Excedrin, and narcotics.  This will help reduce risk of rebound headaches.  Routine exercise Stay adequately hydrated (aim for 64 oz water daily) Keep headache diary Maintain proper stress management Maintain proper sleep hygiene Do not skip meals Consider supplements:  magnesium citrate 4028mdaily, riboflavin 40062maily, coenzyme Q10 100m87mree times daily. 12.  Follow up 4 to 5 months.

## 2022-03-12 ENCOUNTER — Encounter: Payer: Self-pay | Admitting: Neurology

## 2022-03-17 ENCOUNTER — Other Ambulatory Visit (HOSPITAL_COMMUNITY): Payer: Self-pay

## 2022-03-17 ENCOUNTER — Telehealth: Payer: Self-pay

## 2022-03-17 NOTE — Telephone Encounter (Signed)
Answered PA ? & submitted to [facility]

## 2022-03-17 NOTE — Telephone Encounter (Signed)
PA request received via CMM for FreeStyle Libre 2 Sensor  PA has been submitted to [employer] and is pending determination.  Key: [id number]

## 2022-03-18 NOTE — Telephone Encounter (Signed)
PA has been DENIED due to:   You or your doctor recently requested prescription coverage for FREESTYLE LIBRE 2/SENSOR/FLASH GLUCOSE MONITORING SYSTEM SENSOR 2 SEN. After careful consideration and review of your prescription plan's drug list and the information sent to [place], this request was not approved. We understand that this decision may not be what you and your doctor expected. This letter and the enclosed information will explain your options and help you decide what to do next. Why your request was denied: *Drug Not Covered/Plan Exclusion - Your request for coverage was denied because your prescription benefit plan does not cover the requested medication. This decision relates specifically to coverage provided under your prescription benefit plan and does not involve any determination of medical judgment. We reviewed all the supporting information sent to [place] and used your plan's guidelines to make our decision. If you'd like [place] to send you a free copy of the guidelines (requirements, criteria, or protocols) we used, call the number on your prescription ID card. For more information about your prescription benefit, please refer to the prescription benefit section in your benefit plan materials.

## 2022-03-24 ENCOUNTER — Ambulatory Visit
Admission: RE | Admit: 2022-03-24 | Discharge: 2022-03-24 | Disposition: A | Discharge status: Home / Self Care | Payer: Managed Care, Other (non HMO) | Source: Ambulatory Visit | Attending: Neurology

## 2022-03-24 DIAGNOSIS — G43111 Migraine with aura, intractable, with status migrainosus: Secondary | ICD-10-CM

## 2022-03-24 MED ORDER — GADOPICLENOL 0.5 MMOL/ML IV SOLN
7.0000 mL | Freq: Once | INTRAVENOUS | Status: AC | PRN
  Administered 2022-03-24: 7 mL via INTRAVENOUS

## 2022-03-27 NOTE — Progress Notes (Signed)
Advised patient of her MRI results.

## 2022-04-21 ENCOUNTER — Telehealth: Payer: Managed Care, Other (non HMO) | Admitting: Nurse Practitioner

## 2022-04-21 ENCOUNTER — Ambulatory Visit: Payer: Managed Care, Other (non HMO)

## 2022-04-21 DIAGNOSIS — R300 Dysuria: Secondary | ICD-10-CM

## 2022-04-21 NOTE — Progress Notes (Signed)
Based on what you shared with me, I feel your condition warrants further evaluation as soon as possible at an Emergency department.   If a UTI is not improving you may be suffering from an infection in your kidneys.    NOTE: There will be NO CHARGE for this eVisit   If you are having a true medical emergency please call 911.      Emergency Department-[facility]  Get Driving Directions  [phone]  [address]  [place], [place] [address]  Open 24/7/365      [facility] Emergency Department at [facility]  Get Driving Directions  [address]  [place], [place] [address]  Open 24/7/365    Emergency Department- [facility]  Get Driving Directions  [phone]  [address]  [place], [place] [address]  Open 24/7/365      [facility] Emergency Department at [facility]  Get Driving Directions  [phone]  [address]  [place], [place] [address]  Open 24/7/365    [facility]  Emergency Department- [facility] [place] [facility]  Get Driving Directions  [phone]  [address]  [place], [place] [address]  Open 24/7/365    [place]  Emergency Department- [facility] [place]  Get Driving Directions  [address]  [place], [place] [address]  Open 24/7/365    [facility]  Emergency Department- [facility]  Get Driving Directions  [phone]  [address]  [place], [place] [address]  Open 24/7/365

## 2022-05-27 ENCOUNTER — Ambulatory Visit: Payer: Managed Care, Other (non HMO) | Admitting: Medical

## 2022-05-27 VITALS — BP 118/80 | HR 92 | Resp 18 | Ht 70.0 in | Wt 149.8 lb

## 2022-05-27 DIAGNOSIS — R109 Unspecified abdominal pain: Secondary | ICD-10-CM

## 2022-05-27 DIAGNOSIS — R112 Nausea with vomiting, unspecified: Secondary | ICD-10-CM | POA: Diagnosis not present

## 2022-05-27 DIAGNOSIS — R102 Pelvic and perineal pain: Secondary | ICD-10-CM | POA: Diagnosis not present

## 2022-05-27 DIAGNOSIS — E1065 Type 1 diabetes mellitus with hyperglycemia: Secondary | ICD-10-CM

## 2022-05-27 DIAGNOSIS — R319 Hematuria, unspecified: Secondary | ICD-10-CM

## 2022-05-27 DIAGNOSIS — M549 Dorsalgia, unspecified: Secondary | ICD-10-CM | POA: Diagnosis not present

## 2022-05-27 LAB — POCT URINALYSIS DIPSTICK
Bilirubin, UA: NEGATIVE
Glucose, UA: POSITIVE — AB
Ketones, UA: NEGATIVE
Leukocytes, UA: NEGATIVE
Nitrite, UA: NEGATIVE
Protein, UA: NEGATIVE
Spec Grav, UA: 1.01 (ref 1.010–1.025)
Urobilinogen, UA: 0.2 E.U./dL
pH, UA: 6 (ref 5.0–8.0)

## 2022-05-27 MED ORDER — ONDANSETRON HCL 4 MG PO TABS: 4.0000 mg | ORAL_TABLET | Freq: Three times a day (TID) | ORAL | 0 refills | Status: DC | PRN

## 2022-05-27 MED ORDER — PROMETHAZINE HCL 12.5 MG PO TABS: 12.5000 mg | ORAL_TABLET | Freq: Three times a day (TID) | ORAL | 0 refills | Status: DC | PRN

## 2022-05-27 NOTE — Progress Notes (Signed)
Subjective:    Patient ID: [name], female    DOB: 2000/09/07, 22 y.o.   MRN: [id number]  HPI  Pt has various complaints stomach pain, back pain and migraines.  Pt in April dx with kidney stone and then later 3 days had suprabic    04-18-2022  ED visit. 1. Acute pyelonephritis   Clinical Complexity Medical Decision Making Problems Addressed: Acute pyelonephritis: complicated acute illness or injury.   Pt states given antibiotic.   Then 04-21-2022 had second visit. ED Clinical Impression   1. Suprapubic abdominal pain  2. Acute cystitis without hematuria  3. Nausea and vomiting, unspecified vomiting type  4. Hyperglycemia  5. Dehydration   ED Assessment/Plan  Pt is a 22 y.o. female who presented with lower abdominal pain, dysuria, back pain. She had a recent evaluation at this facility for urinary tract infection with negative CT scan. Clinical pyelonephritis was considered. Pt appears to have reassuring labs, improving urinalysis. In addition she has hyperglycemia which better after IV fluids and insulin. She received a dose of Rocephin as well. Plan is discharged home to follow-up with primary care physician within 48 hours for recheck. Strict return precautions were given.  DISCHARGE MEDICATIONS  Medication List    START taking these medications   metoclopramide 10 mg tablet Commonly known as: REGLAN Take 1 tablet (10 mg total) by mouth every 8 (eight) hours as needed (nausea) for up to 5 days.     ASK your doctor about these medications   albuterol HFA 90 mcg/actuation inhaler Commonly known as: PROVENTIL HFA;VENTOLIN HFA;PROAIR HFA Inhale 2 puffs every 6 (six) hours as needed.  azithromycin 250 mg tablet Commonly known as: ZITHROMAX Z-pak as directed  Basaglar Tempo Pen(U-100)Insln 100 unit/mL (3 mL) Inps Generic drug: insulin glargine  BD Ultra-Fine Short Pen Needle 31 gauge x 5/16" Ndle Generic drug: pen needle, diabetic USE TO INJECT  INSULIN  benzonatate 200 mg capsule Commonly known as: TESSALON Take 200 mg by mouth 3 (three) times a day as needed for cough.  blood-glucose meter Misc 1 Device by miscellaneous route 3 (three) times a day.  cefPODOXime 100 mg tablet Commonly known as: VANTIN Take 1 tablet (100 mg total) by mouth 2 (two) times a day for 10 days.  Dexcom G6 Receiver Misc Generic drug: blood-glucose meter,continuous  Levemir FlexTouch U100 Insulin 100 unit/mL (3 mL) Pen injection Generic drug: insulin detemir U-100 Inject 30 Units under the skin 2 (two) times a day.  meloxicam 15 mg tablet Commonly known as: MOBIC  Nurtec ODT 75 mg Tbdl Generic drug: rimegepant  ondansetron 4 mg disintegrating tablet Commonly known as: ZOFRAN-ODT Dissolve 1 tablet (4 mg total) on tongue every 8 (eight) hours as needed for nausea or vomiting for up to 5 days.  Where to Get Your Medications    Pt states her back pain and suprapubic pain never went away completely but still lingering pain. Pain was level 10/10. Now she states mild pain 5/10.   No uti symptoms accompanying above but was dx and treated for uti in april   Lmp- May 14, 2021.  Pt vomting daily since march. Has been 6 months since.  Pt is diabetic and admits parital comlaince. Lost weight over past 2 years. Now ways 149.   Review of Systems  Constitutional:  Negative for chills, fatigue and fever.  HENT:  Negative for congestion and ear discharge.   Respiratory:  Negative for cough, chest tightness, shortness of breath and wheezing.  Cardiovascular:  Negative for chest pain and palpitations.  Gastrointestinal:  Positive for abdominal pain, nausea and vomiting. Negative for abdominal distention, blood in stool and constipation.  Genitourinary:  Negative for dyspareunia, dysuria, frequency, menstrual problem, vaginal bleeding and vaginal pain.  Musculoskeletal:  Negative for back pain, joint swelling and neck pain.  Skin:  Negative for rash.   Neurological:  Negative for dizziness, seizures and headaches.  Psychiatric/Behavioral:  Negative for behavioral problems and confusion.     Past Medical History:  Diagnosis Date   Depression    Diabetes mellitus type I (HCC)    Follows with Endo   Hypertension      Social History   Socioeconomic History   Marital status: Single    Spouse name: Not on file   Number of children: Not on file   Years of education: Not on file   Highest education level: 12th grade  Occupational History   Not on file  Tobacco Use   Smoking status: Never    Passive exposure: Yes   Smokeless tobacco: Never  Vaping Use   Vaping Use: Former  Substance and Sexual Activity   Alcohol use: No    Comment: occ   Drug use: No   Sexual activity: Never    Comment: Having periods X 2 years, no periods X 3 mos  Other Topics Concern   Not on file  Social History Narrative   Are you right handed or left handed? R;ight   Are you currently employed ? 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 Roommate and son   What type of home do you live in: 1 story or 2 story? one    Caffeine occ energy drink   Social Determinants of Health   Financial Resource Strain: Low Risk  (05/27/2022)   Overall Financial Resource Strain (CARDIA)    Difficulty of Paying Living Expenses: Not very hard  Food Insecurity: No Food Insecurity (05/27/2022)   Hunger Vital Sign    Worried About Running Out of Food in the Last Year: Never true    Ran Out of Food in the Last Year: Never true  Transportation Needs: No Transportation Needs (05/27/2022)   PRAPARE - Administrator, Civil Service (Medical): No    Lack of Transportation (Non-Medical): No  Physical Activity: Sufficiently Active (05/27/2022)   Exercise Vital Sign    Days of Exercise per Week: 3 days    Minutes of Exercise per Session: 90 min  Stress: Stress Concern Present (05/27/2022)   [employer] of Occupational Health - Occupational Stress Questionnaire    Feeling of Stress : To some extent  Social Connections: Socially Isolated (05/27/2022)   Social Connection and Isolation Panel [NHANES]    Frequency of Communication with Friends and Family: Three times a week    Frequency of Social Gatherings with Friends and Family: Twice a week    Attends Religious Services: Never    Database administrator or Organizations: No    Attends Engineer, structural: Not on file    Marital Status: Never married  Catering manager Violence: Not on file    Past Surgical History:  Procedure Laterality Date   NO PAST SURGERIES      Family History  Problem Relation Age of Onset   Anxiety disorder Mother    Depression Mother    Gestational diabetes Mother  Drug abuse Father    Diabetes Mellitus I Maternal Aunt        Maternal great aunt   Diabetes Mellitus II Paternal Uncle    Diabetes Mellitus II Paternal Grandfather    ADD / ADHD Paternal Grandfather    Diabetes Paternal Grandfather    Drug abuse Paternal Grandfather    Lupus Maternal Grandmother    Hypertension Maternal Grandmother    Alcohol abuse Maternal Grandfather    Depression Maternal Grandfather    Drug abuse Maternal Grandfather    Mental illness Maternal Grandfather    Alcohol abuse Paternal Grandmother    Drug abuse Paternal Grandmother     No Known Allergies  Current Outpatient Medications on File Prior to Visit  Medication Sig Dispense Refill   Blood Glucose Monitoring Suppl (ACCU-CHEK GUIDE ME) w/Device KIT 1 Device by Does not apply route daily in the afternoon. 1 kit 0   Erenumab-aooe (AIMOVIG) 140 MG/ML SOAJ Inject 140 mg into the skin every 28 (twenty-eight) days. 1.12 mL 11   glucose blood (ACCU-CHEK GUIDE) test strip 1 each by Other route 3 (three) times daily. Use as instructed 300 each 3   Insulin Glargine (BASAGLAR KWIKPEN) 100 UNIT/ML Inject 24 Units into the skin daily. 30 mL 3   Insulin  Pen Needle 32G X 4 MM MISC 1 Device by Does not apply route in the morning, at noon, in the evening, and at bedtime. 400 each 3   naproxen (NAPROSYN) 500 MG tablet Take 1 tablet (500 mg total) by mouth 2 (two) times daily with a meal. 30 tablet 2   NOVOLOG FLEXPEN 100 UNIT/ML FlexPen Max daily 80 units 60 mL 3   SUMAtriptan 6 MG/0.5ML SOAJ Inject 6 mg into the skin as needed. May repeat after 1 hour.  Maximum 2 injections in 24 hours. 5 mL 5   No current facility-administered medications on file prior to visit.    BP 118/80   Pulse 92   Resp 18   Ht 5\' 10"  (1.778 m)   Wt 149 lb 12.8 oz (67.9 kg)   LMP 05/15/2022   SpO2 99%   BMI 21.49 kg/m        Objective:   Physical Exam  General Mental Status- Alert. General Appearance- Not in acute distress.   Skin General: Color- Normal Color. Moisture- Normal Moisture.  Neck Carotid Arteries- Normal color. Moisture- Normal Moisture. No carotid bruits. No JVD.  Chest and Lung Exam Auscultation: Breath Sounds:-Normal.  Cardiovascular Auscultation:Rythm- Regular. Murmurs & Other Heart Sounds:Auscultation of the heart reveals- No Murmurs.  Abdomen Inspection:-Inspeection Normal. Palpation/Percussion:Note:No mass. Palpation and Percussion of the abdomen reveal- Non Tender, Non Distended + BS, no rebound or guarding. Suprapubic tender mild.   Neurologic Cranial Nerve exam:- CN III-XII intact(No nystagmus), symmetric smile. Strength:- 5/5 equal and symmetric strength both upper and lower extremities.   Back- rt cva area mild tender.     Assessment & Plan:   Patient Instructions  1. Costovertebral angle pain Will order ct to see if you have kidney - Urine Culture - CT RENAL STONE STUDY; Future  2. Suprapubic pain Will see if infection cause of your pain. Hold off on treating presntly until culture back. If pending culture symptoms worsen let me know. - Urine Culture  3. Nausea and vomiting, unspecified vomiting type Rx  phenergan. May need to refer to gi as you may have gastroparesis - Lipase - Comp Met (CMET) - CBC w/Diff  4. Flank pain Will follow ct  abd pelvis - POCT Urinalysis Dipstick  5. Hematuria, unspecified type Kidney stone possible cause verse uti. Culture pending and ct pending. - CT RENAL STONE STUDY; Future   Follow up 10 days or sooner if needed.   Esperanza Richters, PA-C   Time spent with patient today was 41  minutes which consisted of chart reveiew 2 ED vsisit, discussing  differential diagnosis, work up, treatment and documentation.

## 2022-05-27 NOTE — Patient Instructions (Signed)
1. Costovertebral angle pain Will order ct to see if you have kidney - Urine Culture - CT RENAL STONE STUDY; Future  2. Suprapubic pain Will see if infection cause of your pain. Hold off on treating presntly until culture back. If pending culture symptoms worsen let me know. - Urine Culture  3. Nausea and vomiting, unspecified vomiting type Rx phenergan. May need to refer to gi as you may have gastroparesis - Lipase - Comp Met (CMET) - CBC w/Diff  4. Flank pain Will follow ct abd pelvis - POCT Urinalysis Dipstick  5. Hematuria, unspecified type Kidney stone possible cause verse uti. Culture pending and ct pending. - CT RENAL STONE STUDY; Future   Follow up 10 days or sooner if needed.

## 2022-05-28 LAB — CBC WITH DIFFERENTIAL/PLATELET
Basophils Absolute: 0 10*3/uL (ref 0.0–0.1)
Basophils Relative: 1.1 % (ref 0.0–3.0)
Eosinophils Absolute: 0.1 10*3/uL (ref 0.0–0.7)
Eosinophils Relative: 2.8 % (ref 0.0–5.0)
HCT: 39.4 % (ref 36.0–46.0)
Hemoglobin: 12.9 g/dL (ref 12.0–15.0)
Lymphocytes Relative: 33.3 % (ref 12.0–46.0)
Lymphs Abs: 1.1 10*3/uL (ref 0.7–4.0)
MCHC: 32.8 g/dL (ref 30.0–36.0)
MCV: 83.7 fl (ref 78.0–100.0)
Monocytes Absolute: 0.5 10*3/uL (ref 0.1–1.0)
Monocytes Relative: 13.7 % — ABNORMAL HIGH (ref 3.0–12.0)
Neutro Abs: 1.6 10*3/uL (ref 1.4–7.7)
Neutrophils Relative %: 49.1 % (ref 43.0–77.0)
Platelets: 192 10*3/uL (ref 150.0–400.0)
RBC: 4.71 Mil/uL (ref 3.87–5.11)
RDW: 13.3 % (ref 11.5–15.5)
WBC: 3.3 10*3/uL — ABNORMAL LOW (ref 4.0–10.5)

## 2022-05-28 LAB — COMPREHENSIVE METABOLIC PANEL
ALT: 10 U/L (ref 0–35)
AST: 10 U/L (ref 0–37)
Albumin: 4.5 g/dL (ref 3.5–5.2)
Alkaline Phosphatase: 53 U/L (ref 39–117)
BUN: 8 mg/dL (ref 6–23)
CO2: 30 mEq/L (ref 19–32)
Calcium: 9.8 mg/dL (ref 8.4–10.5)
Chloride: 100 mEq/L (ref 96–112)
Creatinine, Ser: 0.74 mg/dL (ref 0.40–1.20)
GFR: 114.82 mL/min (ref >60.00)
Glucose, Bld: 277 mg/dL — ABNORMAL HIGH (ref 70–99)
Potassium: 4 mEq/L (ref 3.5–5.1)
Sodium: 138 mEq/L (ref 135–145)
Total Bilirubin: 0.4 mg/dL (ref 0.2–1.2)
Total Protein: 7.1 g/dL (ref 6.0–8.3)

## 2022-05-28 LAB — LIPASE: Lipase: 25 U/L (ref 11.0–59.0)

## 2022-05-29 LAB — URINE CULTURE
MICRO NUMBER:: 14959991
SPECIMEN QUALITY:: ADEQUATE

## 2022-05-30 ENCOUNTER — Encounter: Payer: Self-pay | Admitting: Medical

## 2022-05-30 MED ORDER — CEPHALEXIN 500 MG PO CAPS: 500.0000 mg | ORAL_CAPSULE | Freq: Two times a day (BID) | ORAL | 0 refills | Status: DC

## 2022-05-30 MED ORDER — AMOXICILLIN-POT CLAVULANATE 875-125 MG PO TABS: 1.0000 | ORAL_TABLET | Freq: Two times a day (BID) | ORAL | 0 refills | Status: DC

## 2022-05-30 NOTE — Addendum Note (Signed)
Addended by: Gwenevere Abbot on: 05/30/2022 09:25 AM   Modules accepted: Orders

## 2022-05-30 NOTE — Addendum Note (Signed)
Addended by: Gwenevere Abbot on: 05/30/2022 09:30 AM   Modules accepted: Orders

## 2022-05-30 NOTE — Addendum Note (Signed)
Addended by: Gwenevere Abbot on: 05/30/2022 09:27 AM   Modules accepted: Orders

## 2022-06-09 ENCOUNTER — Encounter: Payer: Self-pay | Admitting: Internal Medicine

## 2022-06-09 ENCOUNTER — Ambulatory Visit: Payer: Managed Care, Other (non HMO) | Admitting: Internal Medicine

## 2022-06-09 VITALS — BP 120/74 | HR 80 | Ht 70.0 in | Wt 145.0 lb

## 2022-06-09 DIAGNOSIS — E1065 Type 1 diabetes mellitus with hyperglycemia: Secondary | ICD-10-CM | POA: Diagnosis not present

## 2022-06-09 LAB — POCT GLYCOSYLATED HEMOGLOBIN (HGB A1C): Hemoglobin A1C: 13.2 % — AB (ref 4.0–5.6)

## 2022-06-09 LAB — POCT GLUCOSE (DEVICE FOR HOME USE): Glucose Fasting, POC: 145 mg/dL — AB (ref 70–99)

## 2022-06-09 MED ORDER — INSULIN PEN NEEDLE 32G X 4 MM MISC: 1.0000 | Freq: Four times a day (QID) | 3 refills | Status: AC

## 2022-06-09 MED ORDER — ACCU-CHEK GUIDE VI STRP: 1.0000 | ORAL_STRIP | Freq: Three times a day (TID) | 3 refills | Status: AC

## 2022-06-09 MED ORDER — TRESIBA FLEXTOUCH 100 UNIT/ML ~~LOC~~ SOPN: 24.0000 [IU] | PEN_INJECTOR | Freq: Every day | SUBCUTANEOUS | 3 refills | Status: AC

## 2022-06-09 MED ORDER — NOVOLOG FLEXPEN 100 UNIT/ML ~~LOC~~ SOPN: PEN_INJECTOR | SUBCUTANEOUS | 3 refills | Status: AC

## 2022-06-09 NOTE — Patient Instructions (Addendum)
Switch Basaglar to [place] 24 units daily  Increase  Novolog 22 units with each meal  Novolog correctional insulin: ADD extra units on insulin to your meal-time Novolog dose if your blood sugars are higher than 170. Use the scale below to help guide you:   Blood sugar before meal Number of units to inject  Less than 170 0 unit  171 -  210 1 units  211 -  250 2 units  251 -  290 3 units  291 -  330 4 units  331 -  370 5 units  371 -  410 6 units  411 -  450 7 units  451 -  490 8 units  491 - 530 9 units    HOW TO TREAT LOW BLOOD SUGARS (Blood sugar LESS THAN 70 MG/DL) Please follow the RULE OF 15 for the treatment of hypoglycemia treatment (when your (blood sugars are less than 70 mg/dL)   STEP 1: Take 15 grams of carbohydrates when your blood sugar is low, which includes:  3-4 GLUCOSE TABS  OR 3-4 OZ OF JUICE OR REGULAR SODA OR ONE TUBE OF GLUCOSE GEL    STEP 2: RECHECK blood sugar in 15 MINUTES STEP 3: If your blood sugar is still low at the 15 minute recheck --> then, go back to STEP 1 and treat AGAIN with another 15 grams of carbohydrates.

## 2022-06-09 NOTE — Progress Notes (Signed)
Name: Tina Fry  MRN/ DOB: [id number], 2000-02-17   Age/ Sex: 22 y.o., female    PCP: Sharlene Dory, DO   Reason for Endocrinology Evaluation: Type 1 Diabetes Mellitus     Date of Initial Endocrinology Visit: 09/01/2021    PATIENT IDENTIFIER: Tina Fry is a 22 y.o. female with a past medical history of T1DM, migraine headaches. The patient presented for initial endocrinology clinic visit on 09/01/2021  for consultative assistance with her diabetes management.    HPI: Tina Fry was    Diagnosed with DM at age 62  Prior Medications tried/Intolerance: Metformin-  no intolerance . Was on insulin pump in high school but stopped due to cost                 Hemoglobin A1c has ranged from 12.6% in 2015, peaking at >15.0% in 2019. Patient has required hospitalization: 12/2020. Last DKA 2016   S/P vaginal deliver 05/2019   On her initial visit to our clinic her A1c 14.0%, she was on Novolog only. We started basal insulin and provided her with correction scale as well    SUBJECTIVE:   During the last visit (01/23/2022): A1c 14.0%  Today (06/09/22): Tina Fry is here for follow-up on diabetes management.  She checks herblood sugars occasionally   Since her last visit here she has had multiple ED visit for variable reasons including pyelonephritis,abdominal pain  She continues with imperfect adherence to insulin except for the past 6 weeks she has been   Works third shift 7pm to 5:30 AM , sleep 7 am till noon  Has nausea that she attributes from Abx  Denies constipation or diarrhea  She snacks during the day , on cucumbers and vegetables        HOME DIABETES REGIMEN: Basaglar  24 units daily  Novolog 20 units TID QAC  CF: Novolog (BG-130/40) - using different      Statin: no ACE-I/ARB: no    METER DOWNLOAD SUMMARY: did not bring     DIABETIC COMPLICATIONS: Microvascular complications:   Denies: CKD , retinopathy, neuropathy  Last  eye exam: Completed 2022  Macrovascular complications:   Denies: CAD, PVD, CVA   PAST HISTORY: Past Medical History:  Past Medical History:  Diagnosis Date   Depression    Diabetes mellitus type I (HCC)    Follows with Endo   Hypertension    Past Surgical History:  Past Surgical History:  Procedure Laterality Date   NO PAST SURGERIES      Social History:  reports that she has never smoked. She has been exposed to tobacco smoke. She has never used smokeless tobacco. She reports that she does not drink alcohol and does not use drugs. Family History:  Family History  Problem Relation Age of Onset   Anxiety disorder Mother    Depression Mother    Gestational diabetes Mother    Drug abuse Father    Diabetes Mellitus I Maternal Aunt        Maternal great aunt   Diabetes Mellitus II Paternal Uncle    Diabetes Mellitus II Paternal Grandfather    ADD / ADHD Paternal Grandfather    Diabetes Paternal Grandfather    Drug abuse Paternal Grandfather    Lupus Maternal Grandmother    Hypertension Maternal Grandmother    Alcohol abuse Maternal Grandfather    Depression Maternal Grandfather    Drug abuse Maternal Grandfather    Mental illness Maternal Grandfather  Alcohol abuse Paternal Grandmother    Drug abuse Paternal Grandmother      HOME MEDICATIONS: Allergies as of 06/09/2022   No Known Allergies      Medication List        Accurate as of Jun 09, 2022 10:23 AM. If you have any questions, ask your nurse or doctor.          STOP taking these medications    cephALEXin 500 MG capsule Commonly known as: KEFLEX Stopped by: Scarlette Shorts, MD       TAKE these medications    Accu-Chek Guide Me w/Device Kit 1 Device by Does not apply route daily in the afternoon.   Accu-Chek Guide test strip Generic drug: glucose blood 1 each by Other route 3 (three) times daily. Use as instructed   Aimovig 140 MG/ML Soaj Generic drug: Erenumab-aooe Inject 140 mg  into the skin every 28 (twenty-eight) days.   amoxicillin-clavulanate 875-125 MG tablet Commonly known as: AUGMENTIN Take 1 tablet by mouth 2 (two) times daily.   Basaglar KwikPen 100 UNIT/ML Inject 24 Units into the skin daily.   Insulin Pen Needle 32G X 4 MM Misc 1 Device by Does not apply route in the morning, at noon, in the evening, and at bedtime.   naproxen 500 MG tablet Commonly known as: NAPROSYN Take 1 tablet (500 mg total) by mouth 2 (two) times daily with a meal.   NovoLOG FlexPen 100 UNIT/ML FlexPen Generic drug: insulin aspart Max daily 80 units   ondansetron 4 MG tablet Commonly known as: Zofran Take 1 tablet (4 mg total) by mouth every 8 (eight) hours as needed for nausea or vomiting.   promethazine 12.5 MG tablet Commonly known as: PHENERGAN Take 1 tablet (12.5 mg total) by mouth every 8 (eight) hours as needed for nausea or vomiting.   SUMAtriptan 6 MG/0.5ML Soaj Inject 6 mg into the skin as needed. May repeat after 1 hour.  Maximum 2 injections in 24 hours.         ALLERGIES: No Known Allergies   REVIEW OF SYSTEMS: A comprehensive ROS was conducted with the patient and is negative except as per HPI    OBJECTIVE:   VITAL SIGNS: BP 120/74 (BP Location: Left Arm, Patient Position: Sitting, Cuff Size: Small)   Pulse 80   Ht 5\' 10"  (1.778 m)   Wt 145 lb (65.8 kg)   LMP 05/15/2022   SpO2 99%   BMI 20.81 kg/m    PHYSICAL EXAM:  General: Pt appears well and is in NAD  Neck: General: Supple without adenopathy or carotid bruits. Thyroid: Thyroid size normal.  No goiter or nodules appreciated.   Lungs: Clear with good BS bilat   Heart: RRR  Abdomen: Soft, nontender  Extremities:  Lower extremities - No pretibial edema  Neuro: MS is good with appropriate affect, pt is alert and Ox3    DM foot exam: 06/09/2022  The skin of the feet is intact without sores or ulcerations. The pedal pulses are 2+ on right and 2+ on left. The sensation is intact  to a screening 5.07, 10 gram monofilament bilaterally   DATA REVIEWED:  Lab Results  Component Value Date   HGBA1C 13.2 (A) 06/09/2022   HGBA1C >15.0 01/23/2022   HGBA1C >15.0 09/01/2021    Latest Reference Range & Units 05/27/22 15:39  Sodium 135 - 145 mEq/L 138  Potassium 3.5 - 5.1 mEq/L 4.0  Chloride 96 - 112 mEq/L 100  CO2 19 -  32 mEq/L 30  Glucose 70 - 99 mg/dL 161 (H)  BUN 6 - 23 mg/dL 8  Creatinine 0.96 - 0.45 mg/dL 4.09  Calcium 8.4 - 81.1 mg/dL 9.8  Alkaline Phosphatase 39 - 117 U/L 53  Albumin 3.5 - 5.2 g/dL 4.5  Lipase 91.4 - 78.2 U/L 25.0  AST 0 - 37 U/L 10  ALT 0 - 35 U/L 10  Total Protein 6.0 - 8.3 g/dL 7.1  Total Bilirubin 0.2 - 1.2 mg/dL 0.4  GFR >95.62 mL/min 114.82  (H): Data is abnormally high  ASSESSMENT / PLAN / RECOMMENDATIONS:   1) Type 1 Diabetes Mellitus, poorly controlled, Without complications - Most recent A1c of 13.2%. Goal A1c < 7.0 %.    -Unfortunately the patient continues with hyperglycemia.  A1c has trended down from >15.0% to 13.2% -Historically she had issues taking Basaglar daily, she has been working on this over the past 6 weeks, I will switch Hospital doctor to [place] as it has a longer Fry-life - CGM and dexcom are costprohibitve - Tandem and Omnipod cost prohibitive  - Has been using mother's glucose meter, I have prescribed accu chek guide in January,2024 but the pt states the pharmacy did not give it to her, today she was provided with Accu-Chek glucose meter and extra strips sent to the pharmacy  - Fasting BG this a.m. 145 mg/dL , would not change Basaglar dose - Will increase NovoLog based on memory recall of a prelunch BG 215 mg/DL, will increase NovoLog based on that statement -Patient advised to use the correction scale before each meal and bedtime   MEDICATIONS: Switch Basaglar to [place] 24 units daily  Increase Novolog 22 units TIDQAC Start Correction Scale :Novolog  (BG-130/40)      EDUCATION / INSTRUCTIONS: BG  monitoring instructions: Patient is instructed to check her blood sugars 3 times a day, before meals. Call [facility] if: BG persistently < 70  I reviewed the Rule of 15 for the treatment of hypoglycemia in detail with the patient. Literature supplied.   2) Diabetic complications:  Eye: Does not have known diabetic retinopathy.  Neuro/ Feet: Does not have known diabetic peripheral neuropathy. Renal: Patient does not have known baseline CKD. She is not on an ACEI/ARB at present.     Follow-up in 4 months  Signed electronically by: Lyndle Herrlich, MD  [facility] Endocrinology  [facility] [address] [place] [place], [place] [address] Phone: [phone] FAX: [phone]   CC: Sharlene Dory, DO [address] [place] [address] Phone: [phone]  Fax: [phone]    Return to Endocrinology clinic as below: Future Appointments  Date Time Provider Department Center  06/11/2022  2:00 PM [facility]  07/22/2022  2:10 PM Drema Dallas, DO [facility] None

## 2022-06-10 ENCOUNTER — Encounter: Payer: Self-pay | Admitting: Internal Medicine

## 2022-06-11 ENCOUNTER — Encounter: Payer: Self-pay | Admitting: Family Medicine

## 2022-06-11 ENCOUNTER — Ambulatory Visit (HOSPITAL_BASED_OUTPATIENT_CLINIC_OR_DEPARTMENT_OTHER): Admission: RE | Admit: 2022-06-11 | Payer: Managed Care, Other (non HMO) | Source: Ambulatory Visit

## 2022-06-11 NOTE — Telephone Encounter (Signed)
FMLA in [address]

## 2022-06-23 ENCOUNTER — Encounter: Payer: Self-pay | Admitting: Family Medicine

## 2022-06-24 ENCOUNTER — Encounter: Payer: Self-pay | Admitting: Family Medicine

## 2022-06-24 ENCOUNTER — Ambulatory Visit (INDEPENDENT_AMBULATORY_CARE_PROVIDER_SITE_OTHER): Payer: Managed Care, Other (non HMO) | Admitting: Family Medicine

## 2022-06-24 VITALS — BP 120/80 | HR 62 | Temp 98.6°F | Ht 70.0 in | Wt 143.2 lb

## 2022-06-24 DIAGNOSIS — R1031 Right lower quadrant pain: Secondary | ICD-10-CM | POA: Diagnosis not present

## 2022-06-24 DIAGNOSIS — R1032 Left lower quadrant pain: Secondary | ICD-10-CM

## 2022-06-24 DIAGNOSIS — G8929 Other chronic pain: Secondary | ICD-10-CM | POA: Insufficient documentation

## 2022-06-24 MED ORDER — AMITRIPTYLINE HCL 10 MG PO TABS
10.0000 mg | ORAL_TABLET | Freq: Every day | ORAL | 1 refills | Status: DC
Start: 2022-06-24 — End: 2022-07-29

## 2022-06-24 NOTE — Patient Instructions (Addendum)
Take the medicine before you go to sleep.  Aim to do some physical exertion for 150 minutes per week. This is typically divided into 5 days per week, 30 minutes per day. The activity should be enough to get your heart rate up. Anything is better than nothing if you have time constraints.  Please consider counseling. Contact [phone] to schedule an appointment or inquire about cost/insurance coverage.  [facility] located at [address], [place], [place].  Phone number = [phone].  Dr. Regan Lemming - Adult Psychiatry.    [facility] located at [address] [place], [place], [place]. Phone number = [phone].   [facility] located at [address] [place], [place].  Phone number = [phone].   [facility] located at [address] [place], [place], [place].  Phone number = [phone].  Let us know if you need anything.

## 2022-06-24 NOTE — Progress Notes (Signed)
Chief Complaint  Patient presents with   Abdominal Pain   Back Pain    Nausea and vomiting    Subjective: Patient is a 22 y.o. female here for abd/back pain.  Started 7 mo ago and died down. Came back 2 mo ago. Usually would come and go until 2 mo ago. 7/10 pain when it comes. Lower abd pain and R flank region. No inj or change in activity. No urinary complaints or bowel changes. Has had N/V since it started, worsening. Has lost 22 lbs unintentionally over 2 mo. Sugars running higher since she started having these symptoms.  No skin changes. Has taken numerous medications including abx for presumed UTI's. Has been on Zofran and Phenergan. Nothing has particularly helped long term. CT Abd/pelv WNL on 04/18/22 and 06/04/22.  Past Medical History:  Diagnosis Date   Depression    Diabetes mellitus type I (HCC)    Follows with Endo   Hypertension     Objective: BP 120/80 (BP Location: Left Arm, Patient Position: Sitting, Cuff Size: Normal)   Pulse 62   Temp 98.6 F (37 C) (Oral)   Ht 5\' 10"  (1.778 m)   Wt 143 lb 4 oz (65 kg)   LMP 05/15/2022   SpO2 97%   BMI 20.55 kg/m  General: Awake, appears stated age Heart: RRR, no LE edema Lungs: CTAB, no rales, wheezes or rhonchi. No accessory muscle use Abdomen: Bowel sounds present, soft, nondistended, mild TTP in the lower quadrants; no guarding, rebound tenderness; negative Rovsing's, McBurney's, negative Murphy's, Carnett's MSK: +ttp over right posterior oblique just under the rib cage Psych: Age appropriate judgment and insight, normal affect and mood  Assessment and Plan: Chronic bilateral lower abdominal pain - Plan: amitriptyline (ELAVIL) 10 MG tablet  Chronic, not controlled.  With her increasing stress/anxiety leading into the symptoms, normal imaging, and normal labs, suspect functional component.  Will start amitriptyline 10 mg nightly.  She tells me she has no interest in getting pregnant and is not currently sexually active.  She  has an appointment with a GI team in 2 months.  I will see her in 1 month to recheck.  If she is still not improved, we will get a pelvic ultrasound and consider other medication. The patient voiced understanding and agreement to the plan.  Jilda Roche [place], DO 06/24/22  4:44 PM

## 2022-07-22 ENCOUNTER — Ambulatory Visit: Payer: Managed Care, Other (non HMO) | Admitting: Neurology

## 2022-07-22 ENCOUNTER — Encounter: Payer: Self-pay | Admitting: Neurology

## 2022-07-22 DIAGNOSIS — Z029 Encounter for administrative examinations, unspecified: Secondary | ICD-10-CM

## 2022-07-24 ENCOUNTER — Ambulatory Visit: Payer: Self-pay | Admitting: Family Medicine

## 2022-07-29 ENCOUNTER — Encounter: Payer: Self-pay | Admitting: Family Medicine

## 2022-07-29 ENCOUNTER — Ambulatory Visit (INDEPENDENT_AMBULATORY_CARE_PROVIDER_SITE_OTHER): Payer: Managed Care, Other (non HMO) | Admitting: Family Medicine

## 2022-07-29 VITALS — BP 108/84 | HR 68 | Temp 98.2°F | Resp 20 | Ht 70.0 in | Wt 144.0 lb

## 2022-07-29 DIAGNOSIS — R1031 Right lower quadrant pain: Secondary | ICD-10-CM | POA: Diagnosis not present

## 2022-07-29 DIAGNOSIS — R109 Unspecified abdominal pain: Secondary | ICD-10-CM

## 2022-07-29 DIAGNOSIS — G8929 Other chronic pain: Secondary | ICD-10-CM | POA: Diagnosis not present

## 2022-07-29 DIAGNOSIS — R1032 Left lower quadrant pain: Secondary | ICD-10-CM

## 2022-07-29 MED ORDER — AMITRIPTYLINE HCL 10 MG PO TABS
10.0000 mg | ORAL_TABLET | Freq: Every day | ORAL | 1 refills | Status: DC
Start: 2022-07-29 — End: 2023-03-08

## 2022-07-29 NOTE — Patient Instructions (Addendum)
Go back on the Elavil.   Call your GI team and ask to be placed on a cancellation list. Please contact them at: [phone]  Let us know if you need anything.

## 2022-07-29 NOTE — Progress Notes (Signed)
Chief Complaint  Patient presents with   Follow-up    Tina Fry is here for abdominal pain.  Duration: 7 months Nighttime awakenings? Yes Bleeding? No Weight loss? Yes-lost around 20 pounds over past 3 months Palliation: Nothing Provocation: Eating Associated symptoms: nausea and vomiting Denies: fever and urinary complaints Treatment to date: None  Past Medical History:  Diagnosis Date   Depression    Diabetes mellitus type I (HCC)    Follows with Endo   Hypertension     BP 108/84 (BP Location: Left Arm, Patient Position: Sitting, Cuff Size: Normal)   Pulse 68   Temp 98.2 F (36.8 C) (Oral)   Resp 20   Ht 5\' 10"  (1.778 m)   Wt 144 lb (65.3 kg)   SpO2 98%   BMI 20.66 kg/m  Gen.: Awake, alert, appears stated age HEENT: Mucous membranes moist without mucosal lesions Heart: Regular rate and rhythm without murmurs Lungs: Clear auscultation bilaterally, no rales or wheezing, normal effort without accessory muscle use. Abdomen: Bowel sounds are present. Abdomen is soft, TTP in epigastric region and both lower quadrants, nondistended, no masses or organomegaly. Negative Murphy's, Rovsing's, McBurney's, and Carnett's sign. Psych: Age appropriate judgment and insight. Normal mood and affect.  Chronic abdominal pain - Plan: H. pylori breath test  Chronic bilateral lower abdominal pain - Plan: amitriptyline (ELAVIL) 10 MG tablet  Chronic, not controlled.  Start Elavil 10 mg daily.  Has appt w GI in 1 mo. recommend she call and ask to be placed on a cancellation list.  Check H. pylori breath test. F/u as originally scheduled Pt voiced understanding and agreement to the plan.  Tina Roche [name], DO 07/29/22 11:09 AM

## 2022-08-13 ENCOUNTER — Other Ambulatory Visit: Payer: Managed Care, Other (non HMO)

## 2022-08-13 ENCOUNTER — Ambulatory Visit: Payer: Self-pay | Admitting: Family Medicine

## 2022-08-13 DIAGNOSIS — G8929 Other chronic pain: Secondary | ICD-10-CM

## 2022-08-26 ENCOUNTER — Encounter: Payer: Self-pay | Admitting: Family Medicine

## 2022-08-26 ENCOUNTER — Telehealth: Payer: Managed Care, Other (non HMO) | Admitting: Family Medicine

## 2022-08-26 DIAGNOSIS — K2900 Acute gastritis without bleeding: Secondary | ICD-10-CM

## 2022-08-26 MED ORDER — PROMETHAZINE HCL 25 MG PO TABS: 25.0000 mg | ORAL_TABLET | Freq: Three times a day (TID) | ORAL | 0 refills | Status: DC | PRN

## 2022-08-26 NOTE — Telephone Encounter (Signed)
Pt called back to schedule and declined appt due to Dr. Hollie Beach schedule being full and only wanting to see him.

## 2022-08-26 NOTE — Progress Notes (Signed)
Chief Complaint  Patient presents with   Nausea     Subjective SRISTI OLLILA is a 22 y.o. female who presents with vomiting and lightheadedness. We are interacting via web portal for an electronic face-to-face visit. I verified patient's ID using 2 identifiers. Patient agreed to proceed with visit via this method. Patient is at home, I am at office. Patient and I are present for visit.  Symptoms began 2 d ago.  Patient has vomiting, diarrhea, and myalgias Patient denies fever and URI symptoms Treatment to date: Tylenol, ibuprofen, Zofran Sick contacts: none known  Past Medical History:  Diagnosis Date   Depression    Diabetes mellitus type I (HCC)    Follows with Endo   Hypertension     Exam No conversational dyspnea Age appropriate judgment and insight Nml affect and mood  Assessment and Plan  Acute gastritis without hemorrhage, unspecified gastritis type - Plan: promethazine (PHENERGAN) 25 MG tablet  She has s/s's of dehydration. Seek immediate care if worsening. If no better, she will message tomorrow and we will see about getting her in w infusion clinic for 1 L of NS.  Avoid aggravating foods, discussed advancing diet. The patient voiced understanding and agreement to the plan.  Jilda Roche [place], DO 08/26/22  12:56 PM

## 2022-08-27 ENCOUNTER — Encounter: Payer: Self-pay | Admitting: Family Medicine

## 2022-08-31 ENCOUNTER — Ambulatory Visit (INDEPENDENT_AMBULATORY_CARE_PROVIDER_SITE_OTHER): Payer: Managed Care, Other (non HMO) | Admitting: Nurse Practitioner

## 2022-08-31 ENCOUNTER — Encounter: Payer: Self-pay | Admitting: Nurse Practitioner

## 2022-08-31 VITALS — BP 100/78 | HR 60 | Ht 70.0 in | Wt 136.0 lb

## 2022-08-31 DIAGNOSIS — R1030 Lower abdominal pain, unspecified: Secondary | ICD-10-CM

## 2022-08-31 DIAGNOSIS — R1010 Upper abdominal pain, unspecified: Secondary | ICD-10-CM

## 2022-08-31 DIAGNOSIS — E1065 Type 1 diabetes mellitus with hyperglycemia: Secondary | ICD-10-CM

## 2022-08-31 DIAGNOSIS — R112 Nausea with vomiting, unspecified: Secondary | ICD-10-CM

## 2022-08-31 MED ORDER — HYOSCYAMINE SULFATE 0.125 MG SL SUBL: 0.1250 mg | SUBLINGUAL_TABLET | Freq: Three times a day (TID) | SUBLINGUAL | 1 refills | Status: DC | PRN

## 2022-08-31 MED ORDER — OMEPRAZOLE 20 MG PO CPDR: 20.0000 mg | DELAYED_RELEASE_CAPSULE | Freq: Every day | ORAL | 1 refills | Status: DC

## 2022-08-31 NOTE — Progress Notes (Signed)
08/31/2022 [name] [id number] 2000/08/30   CHIEF COMPLAINT: Nausea and vomiting   HISTORY OF PRESENT ILLNESS:  Tina Fry is a 22 year old female with a past medical history of depression, hypertension and diabetes mellitus type I diagnosed age 35. She presents to our office today as referred by Esperanza Richters PA-C for further evaluation regarding nausea and vomiting.  She endorses having nausea, vomiting and stomach pain (points to her central lower abdomen) which initially started 11/2021 and occurred once or twice weekly and during her menstrual cycle.  However, on 04/18/2022 she developed "terrible" central lower abdominal pain which lasted for several hours, felt like a period cramp type pain with abdominal bloat.  She presented to the ED at [facility] and a CTAP showed urinary bladder wall thickening possibly indicating cystitis otherwise was unremarkable.  She was prescribed a course of antibiotic for possible UTI.    She has nausea and vomiting daily, emesis described as food she just ate or stomach acid.  No coffee-ground emesis or frank hematemesis.  She has taken promethazine and Zofran with intermittent remittent relief.  She is passing a nonbloody formed stool every few days, stool output has decreased as she is eating less.  She endorses losing 30 lbs since April 2024.  She takes Motrin, ibuprofen or Aleve as needed for 1 or 2 days during her menstrual cycle.  No alcohol use no marijuana or other drug use.  She is working with her endocrinologist to adjust her insulin requirements which have fluctuated since she has lost weight.  She had a temperature of 100.39F one day last week without recurrence.  Labs 08/19/2022: WBC 4.55.  Hemoglobin 13.5.  Hematocrit 40.1.  Platelet 212.  Sodium 135.  Potassium 3.7.  BUN 12.  Creatinine 0.82.  Albumin 4.6.  Total bili 0.8.  Alk phos 53.  AST 11.  ALT 10.  Lipase 15.     Latest Ref Rng & Units 05/27/2022    3:39 PM 01/14/2022    12:56 PM 01/10/2021   10:12 AM  CBC  WBC 4.0 - 10.5 K/uL 3.3  4.3  3.7   Hemoglobin 12.0 - 15.0 g/dL 60.1  09.3  23.5   Hematocrit 36.0 - 46.0 % 39.4  38.7  40.9   Platelets 150.0 - 400.0 K/uL 192.0  201.0  198.0         Latest Ref Rng & Units 05/27/2022    3:39 PM 01/14/2022   12:56 PM 01/10/2021   10:12 AM  CMP  Glucose 70 - 99 mg/dL 573  220  254   BUN 6 - 23 mg/dL 8  12  15    Creatinine 0.40 - 1.20 mg/dL 2.70  6.23  7.62   Sodium 135 - 145 mEq/L 138  132  130   Potassium 3.5 - 5.1 mEq/L 4.0  4.3  4.3   Chloride 96 - 112 mEq/L 100  96  95   CO2 19 - 32 mEq/L 30  25  25    Calcium 8.4 - 10.5 mg/dL 9.8  9.9  9.8   Total Protein 6.0 - 8.3 g/dL 7.1  6.7  7.1   Total Bilirubin 0.2 - 1.2 mg/dL 0.4  0.4  0.5   Alkaline Phos 39 - 117 U/L 53  62  64   AST 0 - 37 U/L 10  8  8    ALT 0 - 35 U/L 10  8  8      CTAP with  contrast 04/18/2022:  FINDINGS:  Lower chest: No acute abnormality.   Hepatobiliary: No focal liver abnormality is seen. No gallstones,  gallbladder wall thickening, or biliary dilatation.   Pancreas: Unremarkable. No pancreatic ductal dilatation or  surrounding inflammatory changes.   Spleen: Normal in size without focal abnormality.   Adrenals/Urinary Tract: Adrenal glands are unremarkable. Kidneys are  normal, without renal calculi, focal lesion, or hydronephrosis.  Bladder wall thickening noted consistent with inflammation or  underdistention. Correlation with UA recommended.   Stomach/Bowel: Stomach is within normal limits. Appendix appears  normal. No evidence of bowel wall thickening, distention, or  inflammatory changes.   Vascular/Lymphatic: No significant vascular findings are present. No  enlarged abdominal or pelvic lymph nodes.   Reproductive: Uterus and bilateral adnexa are unremarkable.   Other: No abdominal wall hernia or abnormality. No abdominopelvic  ascites.   Musculoskeletal: No acute or significant osseous findings.   MRI 03/24/2022 due  to headaches: 1. Normal appearance of the brain. No acute intracranial abnormality. 2. Many nonspecific scalp lesions, amenable to direct inspection.   Past Medical History:  Diagnosis Date   Depression    Diabetes mellitus type I (HCC)    Follows with Endo   Hypertension    Past Surgical History:  Procedure Laterality Date   NO PAST SURGERIES     Social History: She is single.  She has 1 son.  She is a Dentist.  Non-smoker.  No alcohol use.  No drug use.  Family History: family history includes ADD / ADHD in her paternal grandfather; Alcohol abuse in her maternal grandfather and paternal grandmother; Anxiety disorder in her mother; Depression in her maternal grandfather and mother; Diabetes in her paternal grandfather; Diabetes Mellitus I in her maternal aunt; Diabetes Mellitus II in her paternal grandfather and paternal uncle; Drug abuse in her father, maternal grandfather, paternal grandfather, and paternal grandmother; Gestational diabetes in her mother; Hypertension in her maternal grandmother; Lupus in her maternal grandmother; Mental illness in her maternal grandfather. No known family history of GI cancer or IBD.  No Known Allergies    Outpatient Encounter Medications as of 08/31/2022  Medication Sig   amitriptyline (ELAVIL) 10 MG tablet Take 1 tablet (10 mg total) by mouth at bedtime.   Erenumab-aooe (AIMOVIG) 140 MG/ML SOAJ Inject 140 mg into the skin every 28 (twenty-eight) days.   glucose blood (ACCU-CHEK GUIDE) test strip 1 each by Other route 3 (three) times daily. Use as instructed   insulin aspart (NOVOLOG FLEXPEN) 100 UNIT/ML FlexPen Max daily 90 units   insulin degludec (TRESIBA FLEXTOUCH) 100 UNIT/ML FlexTouch Pen Inject 24 Units into the skin daily.   Insulin Pen Needle 32G X 4 MM MISC 1 Device by Does not apply route in the morning, at noon, in the evening, and at bedtime.   naproxen (NAPROSYN) 500 MG tablet Take 1 tablet (500 mg total) by mouth 2 (two) times  daily with a meal.   ondansetron (ZOFRAN) 4 MG tablet Take 1 tablet (4 mg total) by mouth every 8 (eight) hours as needed for nausea or vomiting.   promethazine (PHENERGAN) 25 MG tablet Take 1 tablet (25 mg total) by mouth every 8 (eight) hours as needed for nausea or vomiting.   SUMAtriptan 6 MG/0.5ML SOAJ Inject 6 mg into the skin as needed. May repeat after 1 hour.  Maximum 2 injections in 24 hours.   [DISCONTINUED] promethazine (PHENERGAN) 12.5 MG tablet Take 1 tablet (12.5 mg total) by mouth every 8 (eight) hours  as needed for nausea or vomiting.   No facility-administered encounter medications on file as of 08/31/2022.   REVIEW OF SYSTEMS:  Gen: See HPI. + Fatigue.  CV: Denies chest pain, palpitations or edema. Resp: + SOB.  GI: Denies heartburn, dysphagia, stomach or lower abdominal pain. No diarrhea or constipation.  GU: Denies urinary burning, blood in urine, increased urinary frequency or incontinence. MS: + Back pain and muscle cramps. Derm: Denies rash, itchiness, skin lesions or unhealing ulcers. Psych: + Anxiety and depression. Heme: Denies bruising, easy bleeding. Neuro:  + Headaches. Endo: + DM type I on insulin.   PHYSICAL EXAM: BP 100/78   Pulse 60   Ht 5\' 10"  (1.778 m)   Wt 136 lb (61.7 kg)   BMI 19.51 kg/m   Wt Readings from Last 3 Encounters:  08/31/22 136 lb (61.7 kg)  07/29/22 144 lb (65.3 kg)  06/24/22 143 lb 4 oz (65 kg)    General: 22 year old female in no acute distress. Head: Normocephalic and atraumatic. Eyes:  Sclerae non-icteric, conjunctive pink. Ears: Normal auditory acuity. Mouth: Dentition intact. No ulcers or lesions.  Neck: Supple, no lymphadenopathy or thyromegaly.  Lungs: Clear bilaterally to auscultation without wheezes, crackles or rhonchi. Heart: Regular rate and rhythm. No murmur, rub or gallop appreciated.  Abdomen: Soft, nontender, nondistended. No masses. No hepatosplenomegaly. Normoactive bowel sounds x 4 quadrants.  Rectal:  Deferred.  Musculoskeletal: Symmetrical with no gross deformities. Skin: Warm and dry. No rash or lesions on visible extremities. Extremities: No edema. Neurological: Alert oriented x 4, no focal deficits.  Psychological:  Alert and cooperative. Normal mood and affect.  ASSESSMENT AND PLAN:  22 year old female with diabetes mellitus type 1 with nausea and vomiting with associated weight loss.  CTAP 04/18/2022 findings did not explain the symptoms.  Normal LFTs and lipase level. -Gastric emptying study to rule out gastroparesis -Future EGD, await gastric emptying study results -Patient could not recall if Ondansetron or Promethazine was more effective, she will contact me with her preference of antiemetic when she returns home and looks at her medications  Central lower abdominal pain, likely exacerbated by chronic nausea and vomiting.  CTAP 04/18/2022 findings did not explain etiology for her abdominal pain. -Hyoscyamine 0.125 mg 1 tab sublingual every 8 hours as needed abdominal pain -Consider repeating CTAP if abdominal pain persists or worsens  Diabetes mellitus type 1 on insulin  Headaches.  Brain MRI 03/24/2022 was normal.    CC:  Sharlene Dory*

## 2022-08-31 NOTE — Patient Instructions (Addendum)
You have been scheduled for a gastric emptying scan at [facility] on 09/16/22 at 8:00 am. Please arrive at least 30 minutes prior to your appointment for registration. Please make certain not to have anything to eat or drink after midnight the night before your test. Hold all stomach medications (ex: Zofran, phenergan, Reglan) 24 hours prior to your test. If you need to reschedule your appointment, please contact radiology scheduling at [phone]. ________________________________________________ A gastric-emptying study measures how long it takes for food to move through your stomach. There are several ways to measure stomach emptying. In the most common test, you eat food that contains a small amount of radioactive material. A scanner that detects the movement of the radioactive material is placed over your abdomen to monitor the rate at which food leaves your stomach. This test normally takes about 4 hours to complete. ________________________________________________  Due to recent changes in healthcare laws, you may see the results of your imaging and laboratory studies on MyChart before your provider has had a chance to review them.  We understand that in some cases there may be results that are confusing or concerning to you. Not all laboratory results come back in the same time frame and the provider may be waiting for multiple results in order to interpret others.  Please give [place] 48 hours in order for your provider to thoroughly review all the results before contacting the office for clarification of your results.   Thank you for trusting me with your gastrointestinal care!   Alcide Evener, CRNP

## 2022-09-16 ENCOUNTER — Encounter (HOSPITAL_COMMUNITY): Payer: Self-pay

## 2022-09-16 ENCOUNTER — Encounter (HOSPITAL_COMMUNITY)
Admission: RE | Admit: 2022-09-16 | Discharge: 2022-09-16 | Disposition: A | Discharge status: Home / Self Care | Payer: Managed Care, Other (non HMO) | Source: Ambulatory Visit | Attending: Nurse Practitioner | Admitting: Nurse Practitioner

## 2022-09-16 DIAGNOSIS — R1010 Upper abdominal pain, unspecified: Secondary | ICD-10-CM | POA: Insufficient documentation

## 2022-09-16 DIAGNOSIS — R112 Nausea with vomiting, unspecified: Secondary | ICD-10-CM | POA: Insufficient documentation

## 2022-09-16 DIAGNOSIS — E1065 Type 1 diabetes mellitus with hyperglycemia: Secondary | ICD-10-CM | POA: Insufficient documentation

## 2022-09-16 DIAGNOSIS — R1030 Lower abdominal pain, unspecified: Secondary | ICD-10-CM | POA: Insufficient documentation

## 2022-09-16 MED ORDER — TECHNETIUM TC 99M SULFUR COLLOID
2.2000 | Freq: Once | INTRAVENOUS | Status: AC
  Administered 2022-09-16: 2.2 via ORAL

## 2022-09-17 ENCOUNTER — Encounter (HOSPITAL_BASED_OUTPATIENT_CLINIC_OR_DEPARTMENT_OTHER): Payer: Self-pay

## 2022-09-17 ENCOUNTER — Emergency Department (HOSPITAL_BASED_OUTPATIENT_CLINIC_OR_DEPARTMENT_OTHER)
Admission: EM | Admit: 2022-09-17 | Discharge: 2022-09-18 | Disposition: A | Discharge status: Home / Self Care | Payer: Managed Care, Other (non HMO) | Attending: Emergency Medicine | Admitting: Emergency Medicine

## 2022-09-17 ENCOUNTER — Emergency Department (HOSPITAL_BASED_OUTPATIENT_CLINIC_OR_DEPARTMENT_OTHER): Payer: Managed Care, Other (non HMO)

## 2022-09-17 ENCOUNTER — Other Ambulatory Visit: Payer: Self-pay

## 2022-09-17 DIAGNOSIS — R739 Hyperglycemia, unspecified: Secondary | ICD-10-CM

## 2022-09-17 DIAGNOSIS — R1010 Upper abdominal pain, unspecified: Secondary | ICD-10-CM | POA: Diagnosis present

## 2022-09-17 DIAGNOSIS — R112 Nausea with vomiting, unspecified: Secondary | ICD-10-CM | POA: Insufficient documentation

## 2022-09-17 DIAGNOSIS — Z794 Long term (current) use of insulin: Secondary | ICD-10-CM | POA: Insufficient documentation

## 2022-09-17 DIAGNOSIS — E1065 Type 1 diabetes mellitus with hyperglycemia: Secondary | ICD-10-CM | POA: Insufficient documentation

## 2022-09-17 DIAGNOSIS — R109 Unspecified abdominal pain: Secondary | ICD-10-CM | POA: Diagnosis present

## 2022-09-17 DIAGNOSIS — R1030 Lower abdominal pain, unspecified: Secondary | ICD-10-CM | POA: Diagnosis present

## 2022-09-17 DIAGNOSIS — R1084 Generalized abdominal pain: Secondary | ICD-10-CM | POA: Insufficient documentation

## 2022-09-17 LAB — I-STAT VENOUS BLOOD GAS, ED
Acid-Base Excess: 7 mmol/L — ABNORMAL HIGH (ref 0.0–2.0)
Bicarbonate: 33.7 mmol/L — ABNORMAL HIGH (ref 20.0–28.0)
Calcium, Ion: 1.19 mmol/L (ref 1.15–1.40)
HCT: 43 % (ref 36.0–46.0)
Hemoglobin: 14.6 g/dL (ref 12.0–15.0)
O2 Saturation: 51 %
Patient temperature: 97.6
Potassium: 3.6 mmol/L (ref 3.5–5.1)
Sodium: 135 mmol/L (ref 135–145)
TCO2: 35 mmol/L — ABNORMAL HIGH (ref 22–32)
pCO2, Ven: 51.4 mmHg (ref 44–60)
pH, Ven: 7.422 (ref 7.25–7.43)
pO2, Ven: 26 mmHg — CL (ref 32–45)

## 2022-09-17 LAB — URINALYSIS, ROUTINE W REFLEX MICROSCOPIC
Glucose, UA: >=500 mg/dL — AB
Ketones, ur: 80 mg/dL — AB
Leukocytes,Ua: NEGATIVE
Nitrite: NEGATIVE
Protein, ur: 30 mg/dL — AB
Specific Gravity, Urine: 1.025 (ref 1.005–1.030)
pH: 5 (ref 5.0–8.0)

## 2022-09-17 LAB — URINALYSIS, MICROSCOPIC (REFLEX)

## 2022-09-17 LAB — CBC
HCT: 39.7 % (ref 36.0–46.0)
Hemoglobin: 13.6 g/dL (ref 12.0–15.0)
MCH: 27.6 pg (ref 26.0–34.0)
MCHC: 34.3 g/dL (ref 30.0–36.0)
MCV: 80.5 fL (ref 80.0–100.0)
Platelets: 236 10*3/uL (ref 150–400)
RBC: 4.93 MIL/uL (ref 3.87–5.11)
RDW: 12.1 % (ref 11.5–15.5)
WBC: 5.4 10*3/uL (ref 4.0–10.5)
nRBC: 0 % (ref 0.0–0.2)

## 2022-09-17 LAB — COMPREHENSIVE METABOLIC PANEL
ALT: 15 U/L (ref 0–44)
AST: 15 U/L (ref 15–41)
Albumin: 4.5 g/dL (ref 3.5–5.0)
Alkaline Phosphatase: 63 U/L (ref 38–126)
Anion gap: 17 — ABNORMAL HIGH (ref 5–15)
BUN: 15 mg/dL (ref 6–20)
CO2: 25 mmol/L (ref 22–32)
Calcium: 10 mg/dL (ref 8.9–10.3)
Chloride: 92 mmol/L — ABNORMAL LOW (ref 98–111)
Creatinine, Ser: 0.8 mg/dL (ref 0.44–1.00)
GFR, Estimated: >60 mL/min (ref >60)
Glucose, Bld: 319 mg/dL — ABNORMAL HIGH (ref 70–99)
Potassium: 3.5 mmol/L (ref 3.5–5.1)
Sodium: 134 mmol/L — ABNORMAL LOW (ref 135–145)
Total Bilirubin: 1.2 mg/dL (ref 0.3–1.2)
Total Protein: 8.4 g/dL — ABNORMAL HIGH (ref 6.5–8.1)

## 2022-09-17 LAB — LIPASE, BLOOD: Lipase: 23 U/L (ref 11–51)

## 2022-09-17 LAB — PREGNANCY, URINE: Preg Test, Ur: NEGATIVE

## 2022-09-17 LAB — CBG MONITORING, ED
Glucose-Capillary: 251 mg/dL — ABNORMAL HIGH (ref 70–99)
Glucose-Capillary: 309 mg/dL — ABNORMAL HIGH (ref 70–99)

## 2022-09-17 MED ORDER — IOHEXOL 300 MG/ML  SOLN
100.0000 mL | Freq: Once | INTRAMUSCULAR | Status: AC | PRN
  Administered 2022-09-17: 100 mL via INTRAVENOUS

## 2022-09-17 MED ORDER — ONDANSETRON HCL 4 MG/2ML IJ SOLN
4.0000 mg | Freq: Once | INTRAMUSCULAR | Status: AC
  Administered 2022-09-17: 4 mg via INTRAVENOUS
  Filled 2022-09-17: qty 2

## 2022-09-17 MED ORDER — METOCLOPRAMIDE HCL 5 MG/ML IJ SOLN
10.0000 mg | Freq: Once | INTRAMUSCULAR | Status: AC
  Administered 2022-09-17: 10 mg via INTRAVENOUS
  Filled 2022-09-17: qty 2

## 2022-09-17 MED ORDER — LACTATED RINGERS IV BOLUS
1000.0000 mL | Freq: Once | INTRAVENOUS | Status: AC
  Administered 2022-09-17: 1000 mL via INTRAVENOUS

## 2022-09-17 MED ORDER — DIPHENHYDRAMINE HCL 50 MG/ML IJ SOLN
25.0000 mg | Freq: Once | INTRAMUSCULAR | Status: AC
  Administered 2022-09-17: 25 mg via INTRAVENOUS
  Filled 2022-09-17: qty 1

## 2022-09-17 MED ORDER — ALUM & MAG HYDROXIDE-SIMETH 200-200-20 MG/5ML PO SUSP
15.0000 mL | Freq: Once | ORAL | Status: AC
  Administered 2022-09-17: 15 mL via ORAL
  Filled 2022-09-17: qty 30

## 2022-09-17 NOTE — ED Notes (Signed)
Pt's POC CBG performed but results did not cross over. Result was 251. Provider aware.

## 2022-09-17 NOTE — ED Notes (Signed)
Pt taken to CT.

## 2022-09-17 NOTE — ED Triage Notes (Signed)
Pt c/o abdominal pain x 4 days.  Episodes like this has been going on since March N/V states she can not keep anything down Generalized pain esp around belly button

## 2022-09-18 MED ORDER — METOCLOPRAMIDE HCL 10 MG PO TABS: 10.0000 mg | ORAL_TABLET | Freq: Four times a day (QID) | ORAL | 0 refills | Status: DC | PRN

## 2022-09-18 NOTE — Discharge Instructions (Signed)
You were seen in the emergency room today for evaluation of your abdominal pain with nausea and vomiting.  I am glad that you are feeling much better.  I am going to prescribe you some Reglan to take at home as needed for your symptoms.  Please make sure you are staying well-hydrated drinking plenty of fluids, mainly water.  I am recommending a bland diet.  Please make sure you are dosing yourself appropriately with your insulin.  You will need to follow-up with your GI/gastroenterologist.  Please make sure you contact them to schedule an appointment or let them know of your visit here in the emergency department.  Additional information about this visit and information attached to the discharge report.  Please review.  If you have any concerns, new or worsening symptoms, please return to your nearest emergency department for evaluation.  Contact a doctor if: Your symptoms get worse. You have new symptoms. You have a fever. You cannot drink fluids without vomiting. You feel like you may vomit for more than 2 days. You feel light-headed or dizzy. You have a headache. You have muscle cramps. You have a rash. You have pain while peeing. Get help right away if: You have pain in your chest, neck, arm, or jaw. You feel very weak or you faint. You vomit again and again. You have vomit that is bright red or looks like black coffee grounds. You have bloody or black poop (stools) or poop that looks like tar. You have a very bad headache, a stiff neck, or both. You have very bad pain, cramping, or bloating in your belly (abdomen). You have trouble breathing. You are breathing very quickly. Your heart is beating very quickly. Your skin feels cold and clammy. You feel confused. You have signs of losing too much water in your body, such as: Dark pee, very little pee, or no pee. Cracked lips. Dry mouth. Sunken eyes. Sleepiness. Weakness. These symptoms may be an emergency. Get help right away. Call  911. Do not wait to see if the symptoms will go away. Do not drive yourself to the hospital.

## 2022-09-18 NOTE — Telephone Encounter (Signed)
Tina Fry, pls contact patient on Monday with an update 9/9 for an update as she ended up going to the ED 9/5 and CTAP was unrevealing. Pls provide with the update as I will be out of the office all next week.   Dr. Lavon Paganini, see patient message below, patient was unable to complete the gastric empty study.

## 2022-09-18 NOTE — ED Provider Notes (Signed)
[facility] Provider Note   CSN: [id number] Arrival date & time: 09/17/22  1910     History {Add pertinent medical, surgical, social history, OB history to HPI:1} Chief Complaint  Patient presents with   Abdominal Pain    Tina Fry is a 21 y.o. female.   Abdominal Pain      Home Medications Prior to Admission medications   Medication Sig Start Date End Date Taking? Authorizing Provider  amitriptyline (ELAVIL) 10 MG tablet Take 1 tablet (10 mg total) by mouth at bedtime. 07/29/22   Wendling, Jilda Roche, DO  Erenumab-aooe (AIMOVIG) 140 MG/ML SOAJ Inject 140 mg into the skin every 28 (twenty-eight) days. 03/06/22   Everlena Cooper, Adam R, DO  glucose blood (ACCU-CHEK GUIDE) test strip 1 each by Other route 3 (three) times daily. Use as instructed 06/09/22   Shamleffer, Konrad Dolores, MD  hyoscyamine (LEVSIN SL) 0.125 MG SL tablet Place 1 tablet (0.125 mg total) under the tongue every 8 (eight) hours as needed. 08/31/22   Arnaldo Natal, NP  insulin aspart (NOVOLOG FLEXPEN) 100 UNIT/ML FlexPen Max daily 90 units 06/09/22   Shamleffer, Konrad Dolores, MD  insulin degludec (TRESIBA FLEXTOUCH) 100 UNIT/ML FlexTouch Pen Inject 24 Units into the skin daily. 06/09/22   Shamleffer, Konrad Dolores, MD  Insulin Pen Needle 32G X 4 MM MISC 1 Device by Does not apply route in the morning, at noon, in the evening, and at bedtime. 06/09/22   Shamleffer, Konrad Dolores, MD  naproxen (NAPROSYN) 500 MG tablet Take 1 tablet (500 mg total) by mouth 2 (two) times daily with a meal. 01/14/22   Wendling, Jilda Roche, DO  omeprazole (PRILOSEC) 20 MG capsule Take 1 capsule (20 mg total) by mouth daily. Take 30 minutes before breakfast. 08/31/22   Arnaldo Natal, NP  ondansetron (ZOFRAN) 4 MG tablet Take 1 tablet (4 mg total) by mouth every 8 (eight) hours as needed for nausea or vomiting. 05/27/22   Saguier, Ramon Dredge, PA-C  promethazine (PHENERGAN)  25 MG tablet Take 1 tablet (25 mg total) by mouth every 8 (eight) hours as needed for nausea or vomiting. 08/26/22   Sharlene Dory, DO  SUMAtriptan 6 MG/0.5ML SOAJ Inject 6 mg into the skin as needed. May repeat after 1 hour.  Maximum 2 injections in 24 hours. 03/06/22   Drema Dallas, DO      Allergies    Patient has no known allergies.    Review of Systems   Review of Systems  Gastrointestinal:  Positive for abdominal pain.    Physical Exam Updated Vital Signs BP 118/66   Pulse 88   Temp 98 F (36.7 C) (Oral)   Resp 16   Ht 5\' 10"  (1.778 m)   Wt 61.2 kg   LMP 09/13/2022   SpO2 99%   BMI 19.37 kg/m  Physical Exam  ED Results / Procedures / Treatments   Labs (all labs ordered are listed, but only abnormal results are displayed) Labs Reviewed  COMPREHENSIVE METABOLIC PANEL - Abnormal; Notable for the following components:      Result Value   Sodium 134 (*)    Chloride 92 (*)    Glucose, Bld 319 (*)    Total Protein 8.4 (*)    Anion gap 17 (*)    All other components within normal limits  URINALYSIS, ROUTINE W REFLEX MICROSCOPIC - Abnormal; Notable for the following components:   Glucose, UA >=500 (*)  Hgb urine dipstick LARGE (*)    Bilirubin Urine SMALL (*)    Ketones, ur 80 (*)    Protein, ur 30 (*)    All other components within normal limits  URINALYSIS, MICROSCOPIC (REFLEX) - Abnormal; Notable for the following components:   Bacteria, UA RARE (*)    All other components within normal limits  CBG MONITORING, ED - Abnormal; Notable for the following components:   Glucose-Capillary 309 (*)    All other components within normal limits  I-STAT VENOUS BLOOD GAS, ED - Abnormal; Notable for the following components:   pO2, Ven 26 (*)    Bicarbonate 33.7 (*)    TCO2 35 (*)    Acid-Base Excess 7.0 (*)    All other components within normal limits  CBG MONITORING, ED - Abnormal; Notable for the following components:   Glucose-Capillary 251 (*)    All  other components within normal limits  LIPASE, BLOOD  CBC  PREGNANCY, URINE  BLOOD GAS, VENOUS    EKG None  Radiology CT ABDOMEN PELVIS W CONTRAST  Result Date: 09/17/2022 CLINICAL DATA:  Acute abdominal pain. EXAM: CT ABDOMEN AND PELVIS WITH CONTRAST TECHNIQUE: Multidetector CT imaging of the abdomen and pelvis was performed using the standard protocol following bolus administration of intravenous contrast. RADIATION DOSE REDUCTION: This exam was performed according to the departmental dose-optimization program which includes automated exposure control, adjustment of the mA and/or kV according to patient size and/or use of iterative reconstruction technique. CONTRAST:  OMNIPAQUE IOHEXOL 300 MG/ML  SOLN COMPARISON:  Prior CTs, most recently 06/04/2022, prior imaging obtained at [facility]. FINDINGS: Lower chest: Clear lung bases. Hepatobiliary: No focal liver abnormality is seen. No gallstones, gallbladder wall thickening, or biliary dilatation. Pancreas: Unremarkable. No pancreatic ductal dilatation or surrounding inflammatory changes. Spleen: Normal in size without focal abnormality. Adrenals/Urinary Tract: No adrenal nodule. No hydronephrosis, renal calculi or focal renal abnormality. Nondistended urinary bladder. Stomach/Bowel: Detailed bowel evaluation is limited in the absence of enteric contrast. Mild fluid distention of the stomach. No gastric wall thickening. There is no small bowel obstruction or inflammation. Normal appendix. Small to moderate volume of stool in the colon. Vascular/Lymphatic: No acute vascular findings. Normal caliber abdominal aorta. Patent portal vein. No abdominopelvic adenopathy. Reproductive: Retroverted uterus.  No adnexal mass. Other: Trace free fluid in the pelvis, likely physiologic. No upper abdominal ascites. No free air. Musculoskeletal: There are no acute or suspicious osseous abnormalities. IMPRESSION: No acute abnormality or explanation for abdominal pain.  Electronically Signed   By: Narda Rutherford M.D.   On: 09/17/2022 21:40    Procedures Procedures  {Document cardiac monitor, telemetry assessment procedure when appropriate:1}  Medications Ordered in ED Medications  ondansetron (ZOFRAN) injection 4 mg (4 mg Intravenous Given 09/17/22 2009)  lactated ringers bolus 1,000 mL (0 mLs Intravenous Stopped 09/17/22 2127)  lactated ringers bolus 1,000 mL (0 mLs Intravenous Stopped 09/17/22 2346)  metoCLOPramide (REGLAN) injection 10 mg (10 mg Intravenous Given 09/17/22 2056)  diphenhydrAMINE (BENADRYL) injection 25 mg (25 mg Intravenous Given 09/17/22 2056)  iohexol (OMNIPAQUE) 300 MG/ML solution 100 mL (100 mLs Intravenous Contrast Given 09/17/22 2055)  alum & mag hydroxide-simeth (MAALOX/MYLANTA) 200-200-20 MG/5ML suspension 15 mL (15 mLs Oral Given 09/17/22 2125)    ED Course/ Medical Decision Making/ A&P   {   Click here for ABCD2, HEART and other calculatorsREFRESH Note before signing :1}  Medical Decision Making Amount and/or Complexity of Data Reviewed Labs: ordered. Radiology: ordered.  Risk OTC drugs. Prescription drug management.   ***  {Document critical care time when appropriate:1} {Document review of labs and clinical decision tools ie heart score, Chads2Vasc2 etc:1}  {Document your independent review of radiology images, and any outside records:1} {Document your discussion with family members, caretakers, and with consultants:1} {Document social determinants of health affecting pt's care:1} {Document your decision making why or why not admission, treatments were needed:1} Final Clinical Impression(s) / ED Diagnoses Final diagnoses:  None    Rx / DC Orders ED Discharge Orders     None

## 2022-09-21 NOTE — Telephone Encounter (Signed)
Pt stated that she is still having the same symptoms. Pt stated that she went to the ED on the 5th and her symptoms were treaded there and then discharged. Pt stated that she is still having abdominal pain, vomiting multiple times daily and not getting any better. Pt stated that she has not been to work since last Wednesday.  Please review and advise.

## 2022-09-21 NOTE — Telephone Encounter (Signed)
Left message for pt to call back  °

## 2022-09-22 ENCOUNTER — Encounter: Payer: Self-pay | Admitting: Family Medicine

## 2022-09-23 ENCOUNTER — Encounter (HOSPITAL_BASED_OUTPATIENT_CLINIC_OR_DEPARTMENT_OTHER): Payer: Self-pay

## 2022-09-23 ENCOUNTER — Ambulatory Visit (INDEPENDENT_AMBULATORY_CARE_PROVIDER_SITE_OTHER): Payer: Managed Care, Other (non HMO)

## 2022-09-23 ENCOUNTER — Emergency Department (HOSPITAL_BASED_OUTPATIENT_CLINIC_OR_DEPARTMENT_OTHER)
Admission: EM | Admit: 2022-09-23 | Discharge: 2022-09-23 | Disposition: A | Discharge status: Home / Self Care | Payer: Managed Care, Other (non HMO) | Attending: Emergency Medicine | Admitting: Emergency Medicine

## 2022-09-23 VITALS — BP 121/87 | HR 77 | Temp 98.4°F | Resp 17 | Ht 70.0 in | Wt 133.6 lb

## 2022-09-23 DIAGNOSIS — R1030 Lower abdominal pain, unspecified: Secondary | ICD-10-CM | POA: Insufficient documentation

## 2022-09-23 DIAGNOSIS — R110 Nausea: Secondary | ICD-10-CM

## 2022-09-23 DIAGNOSIS — R112 Nausea with vomiting, unspecified: Secondary | ICD-10-CM | POA: Diagnosis present

## 2022-09-23 DIAGNOSIS — E860 Dehydration: Secondary | ICD-10-CM

## 2022-09-23 DIAGNOSIS — R1111 Vomiting without nausea: Secondary | ICD-10-CM

## 2022-09-23 LAB — URINALYSIS, ROUTINE W REFLEX MICROSCOPIC
Bilirubin Urine: NEGATIVE
Glucose, UA: >=500 mg/dL — AB
Hgb urine dipstick: NEGATIVE
Ketones, ur: NEGATIVE mg/dL
Leukocytes,Ua: NEGATIVE
Nitrite: NEGATIVE
Protein, ur: NEGATIVE mg/dL
Specific Gravity, Urine: 1.02 (ref 1.005–1.030)
pH: 7.5 (ref 5.0–8.0)

## 2022-09-23 LAB — COMPREHENSIVE METABOLIC PANEL
ALT: 23 U/L (ref 0–44)
AST: 25 U/L (ref 15–41)
Albumin: 4.1 g/dL (ref 3.5–5.0)
Alkaline Phosphatase: 51 U/L (ref 38–126)
Anion gap: 10 (ref 5–15)
BUN: 6 mg/dL (ref 6–20)
CO2: 28 mmol/L (ref 22–32)
Calcium: 9.2 mg/dL (ref 8.9–10.3)
Chloride: 100 mmol/L (ref 98–111)
Creatinine, Ser: 0.65 mg/dL (ref 0.44–1.00)
GFR, Estimated: >60 mL/min (ref >60)
Glucose, Bld: 300 mg/dL — ABNORMAL HIGH (ref 70–99)
Potassium: 3.6 mmol/L (ref 3.5–5.1)
Sodium: 138 mmol/L (ref 135–145)
Total Bilirubin: 0.8 mg/dL (ref 0.3–1.2)
Total Protein: 7.1 g/dL (ref 6.5–8.1)

## 2022-09-23 LAB — URINALYSIS, MICROSCOPIC (REFLEX): WBC, UA: NONE SEEN WBC/hpf (ref 0–5)

## 2022-09-23 LAB — CBC
HCT: 35.6 % — ABNORMAL LOW (ref 36.0–46.0)
Hemoglobin: 12.2 g/dL (ref 12.0–15.0)
MCH: 27.7 pg (ref 26.0–34.0)
MCHC: 34.3 g/dL (ref 30.0–36.0)
MCV: 80.9 fL (ref 80.0–100.0)
Platelets: 248 10*3/uL (ref 150–400)
RBC: 4.4 MIL/uL (ref 3.87–5.11)
RDW: 11.9 % (ref 11.5–15.5)
WBC: 3.8 10*3/uL — ABNORMAL LOW (ref 4.0–10.5)
nRBC: 0 % (ref 0.0–0.2)

## 2022-09-23 LAB — PREGNANCY, URINE: Preg Test, Ur: NEGATIVE

## 2022-09-23 LAB — LIPASE, BLOOD: Lipase: 28 U/L (ref 11–51)

## 2022-09-23 MED ORDER — DIPHENHYDRAMINE HCL 50 MG/ML IJ SOLN
25.0000 mg | Freq: Once | INTRAMUSCULAR | Status: AC
  Administered 2022-09-23: 25 mg via INTRAVENOUS
  Filled 2022-09-23: qty 1

## 2022-09-23 MED ORDER — LACTATED RINGERS IV BOLUS
1000.0000 mL | Freq: Once | INTRAVENOUS | Status: AC
  Administered 2022-09-23: 1000 mL via INTRAVENOUS

## 2022-09-23 MED ORDER — PROMETHAZINE HCL 25 MG RE SUPP: 25.0000 mg | Freq: Four times a day (QID) | RECTAL | 0 refills | Status: DC | PRN

## 2022-09-23 MED ORDER — SODIUM CHLORIDE 0.9 % IV BOLUS
1000.0000 mL | Freq: Once | INTRAVENOUS | Status: AC
  Administered 2022-09-23: 1000 mL via INTRAVENOUS

## 2022-09-23 MED ORDER — DROPERIDOL 2.5 MG/ML IJ SOLN
2.5000 mg | Freq: Once | INTRAMUSCULAR | Status: AC
  Administered 2022-09-23: 2.5 mg via INTRAVENOUS
  Filled 2022-09-23: qty 2

## 2022-09-23 NOTE — ED Provider Notes (Signed)
[place] [facility] Provider Note   CSN: [id number] Arrival date & time: 09/23/22  2059     History Chief Complaint  Patient presents with   Abdominal Pain    HPI Tina Fry is a 22 y.o. female presenting for chronic abdominal pain with acute flare.  History diabetes diagnosed gastroparesis per gastroenterology.  She is in no acute distress at this time   Patient's recorded medical, surgical, social, medication list and allergies were reviewed in the Snapshot window as part of the initial history.   Review of Systems   Review of Systems  Constitutional:  Negative for chills and fever.  HENT:  Negative for ear pain and sore throat.   Eyes:  Negative for pain and visual disturbance.  Respiratory:  Negative for cough and shortness of breath.   Cardiovascular:  Negative for chest pain and palpitations.  Gastrointestinal:  Positive for abdominal pain, nausea and vomiting.  Genitourinary:  Negative for dysuria and hematuria.  Musculoskeletal:  Negative for arthralgias and back pain.  Skin:  Negative for color change and rash.  Neurological:  Negative for seizures and syncope.  All other systems reviewed and are negative.   Physical Exam Updated Vital Signs BP (!) 124/95   Pulse 78   Temp 97.8 F (36.6 C)   Resp 15   Ht 5\' 10"  (1.778 m)   Wt 59 kg   LMP 09/13/2022   SpO2 100%   BMI 18.65 kg/m  Physical Exam Vitals and nursing note reviewed.  Constitutional:      General: She is not in acute distress.    Appearance: She is well-developed.  HENT:     Head: Normocephalic and atraumatic.  Eyes:     Conjunctiva/sclera: Conjunctivae normal.  Cardiovascular:     Rate and Rhythm: Normal rate and regular rhythm.     Heart sounds: No murmur heard. Pulmonary:     Effort: Pulmonary effort is normal. No respiratory distress.     Breath sounds: Normal breath sounds.  Abdominal:     General: There is no distension.     Palpations:  Abdomen is soft.     Tenderness: There is no abdominal tenderness. There is no right CVA tenderness or left CVA tenderness.  Musculoskeletal:        General: No swelling or tenderness. Normal range of motion.     Cervical back: Neck supple.  Skin:    General: Skin is warm and dry.  Neurological:     General: No focal deficit present.     Mental Status: She is alert and oriented to person, place, and time. Mental status is at baseline.     Cranial Nerves: No cranial nerve deficit.      ED Course/ Medical Decision Making/ A&P    Procedures Procedures   Medications Ordered in ED Medications  droperidol (INAPSINE) 2.5 MG/ML injection 2.5 mg (2.5 mg Intravenous Given 09/23/22 2210)  diphenhydrAMINE (BENADRYL) injection 25 mg (25 mg Intravenous Given 09/23/22 2210)  lactated ringers bolus 1,000 mL (0 mLs Intravenous Stopped 09/23/22 2251)    Medical Decision Making:    Tina Fry is a 22 y.o. female who presented to the ED today with acute on chronic nausea vomiting abdominal pain detailed above.     Complete initial physical exam performed, notably the patient  was Hemodynamically stable no acute distress .      Reviewed and confirmed nursing documentation for past medical history, family history, social history.  Initial Assessment:   With the patient's presentation of nausea vomiting abdominal pain, most likely diagnosis is gastroparesis flare. Other diagnoses were considered including (but not limited to), cyclic vomiting syndrome, pancreatitis, appendicitis, cholecystitis. These are considered less likely due to history of present illness and physical exam findings.   This is most consistent with an acute life/limb threatening illness complicated by underlying chronic conditions.  Initial Plan:  Empiric treatment of symptoms with droperidol and Benadryl with plan for reassessment Screening labs including CBC and Metabolic panel to evaluate for infectious or metabolic  etiology of disease.  Urinalysis with reflex culture ordered to evaluate for UTI or relevant urologic/nephrologic pathology.  CXR to evaluate for structural/infectious intrathoracic pathology.  EKG to evaluate for cardiac pathology. Objective evaluation as below reviewed with plan for close reassessment  Initial Study Results:   Laboratory  All laboratory results reviewed without evidence of clinically relevant pathology.     Reassessment and Plan:   Reassessed after 2 hours to the emergency room.  She is now asymptomatic having complete resolution of syndrome. Resting comfortably is finished her IV fluids has been able to p.o. challenge successfully.  She feels comfortable discharge plan to follow-up with gastroenterology.  Promethazine suppositories ordered for as needed use.   Disposition:  I have considered need for hospitalization, however, considering all of the above, I believe this patient is stable for discharge at this time.  Patient/family educated about specific return precautions for given chief complaint and symptoms.  Patient/family educated about follow-up with PCP .     Patient/family expressed understanding of return precautions and need for follow-up. Patient spoken to regarding all imaging and laboratory results and appropriate follow up for these results. All education provided in verbal form with additional information in written form. Time was allowed for answering of patient questions. Patient discharged.    Emergency Department Medication Summary:   Medications  droperidol (INAPSINE) 2.5 MG/ML injection 2.5 mg (2.5 mg Intravenous Given 09/23/22 2210)  diphenhydrAMINE (BENADRYL) injection 25 mg (25 mg Intravenous Given 09/23/22 2210)  lactated ringers bolus 1,000 mL (0 mLs Intravenous Stopped 09/23/22 2251)         Clinical Impression:  1. Nausea   2. Vomiting without nausea, unspecified vomiting type      Discharge   Final Clinical Impression(s) / ED  Diagnoses Final diagnoses:  Nausea  Vomiting without nausea, unspecified vomiting type    Rx / DC Orders ED Discharge Orders          Ordered    promethazine (PHENERGAN) 25 MG suppository  Every 6 hours PRN        09/23/22 2254              Glyn Ade, MD 09/23/22 2254

## 2022-09-23 NOTE — ED Notes (Signed)
Pain rated 3/10. Patient stats she feels much better. Patient was given something to eat and drink.

## 2022-09-23 NOTE — ED Triage Notes (Signed)
Pt states that she has been having lower abd pain since April today got worse around 5pm, with n/v

## 2022-09-23 NOTE — Progress Notes (Signed)
Diagnosis: Dehydration  Provider:  Chilton Greathouse MD  Procedure: IV Infusion  IV Type: Peripheral, IV Location: R Antecubital  Normal Saline, Dose: 1000 ml  Infusion Start Time: 1355  Infusion Stop Time: 1500  Post Infusion IV Care: Peripheral IV Discontinued  Discharge: Condition: Good, Destination: Home . AVS Declined  Performed by:  Rico Ala, LPN

## 2022-09-28 NOTE — Telephone Encounter (Signed)
Schedule office visit either with me or APP next available appointment.  If patient is willing to try repeating the gastric emptying study, we can reschedule it.  Please advise patient to eat small frequent meals with low-fat diet and maintain hydration.

## 2022-09-29 NOTE — Telephone Encounter (Signed)
Pt was made aware of Dr. Lavon Paganini recommendations: Pt was scheduled for an Office Visit on 10/06/2022 at 1:30 with Hyacinth Meeker PA. Pt made aware. Pt stated that she wish to hold hoff on scheduling the gastric emptying scan until after the office visit. Pt verbalized understanding with all questions answered.

## 2022-10-06 ENCOUNTER — Ambulatory Visit (INDEPENDENT_AMBULATORY_CARE_PROVIDER_SITE_OTHER): Payer: Managed Care, Other (non HMO) | Admitting: Physician Assistant

## 2022-10-06 ENCOUNTER — Encounter: Payer: Self-pay | Admitting: Physician Assistant

## 2022-10-06 VITALS — BP 110/80 | HR 94 | Ht 70.0 in | Wt 132.6 lb

## 2022-10-06 DIAGNOSIS — R112 Nausea with vomiting, unspecified: Secondary | ICD-10-CM

## 2022-10-06 DIAGNOSIS — R1030 Lower abdominal pain, unspecified: Secondary | ICD-10-CM

## 2022-10-06 DIAGNOSIS — E1065 Type 1 diabetes mellitus with hyperglycemia: Secondary | ICD-10-CM

## 2022-10-06 NOTE — Progress Notes (Signed)
Chief Complaint: Follow up Nausea And vomiting  HPI:    Tina Fry is a 22 year old African-American female, assigned to Dr. Lavon Paganini, with a past medical history as listed below including diabetes, who was referred to me by Sharlene Dory* for follow-up of nausea and vomiting.    08/31/2022 office visit with Tina Fry and at that time discussed nausea vomiting and abdominal pain.  Typically during her menstrual cycle.  Apparently been to the ED with a CTAP which showed urinary bladder wall thickening possibly indicating cystitis.  She was prescribed antibiotics.  Normal LFTs and lipase level.  Recommended gastric emptying study to rule out gastroparesis and a future EGD.  Also discussed central lower abdominal pain exacerbated by chronic nausea and vomiting.  She was given Hyoscyamine.    09/16/22 patient showed up for gastric emptying study but could not keep legs down for 5 minutes and was told to call us.  She ended up going to the ED on 9/5 and repeat CT abdomen pelvis was unrevealing.  At that point told to try small frequent meals.    09/23/2022 patient seen in the ED again for chronic abdominal pain with an acute flare.  CMP with a glucose elevated at 300.  CBC with a white count low at 3.8.  Urinalysis with glucosuria.  Patient was treated with Droperidol and Benadryl and symptoms resolved.  She was given Promethazine suppositories as needed.    Today, patient presents to clinic and tells me that her symptoms are 100 times worse with at least 8-10 episodes of vomiting a day and increased abdominal pain in her lower abdomen with cramping when she is on her menstrual cycle.  This is when she was seen in the ED as above.  She has now stopped her cycle and she still has some nausea and vomiting but it is definitely not as much.  Currently using her Promethazine 25 mg suppositories typically twice a day, sometimes 3 times a day which does help with the nausea.  She is also on  Omeprazole 20 mg daily and occasionally uses Reglan 10 mg as needed if she has nausea which does not stop.  She has not tried the Levsin.  Describes that she is currently not on any birth control and has not followed with her OB/GYN recently.  Has also plan to follow-up with her endocrinologist at the end of October given that her glucose is all over the place.  Doing better with just eating soup and some easier to eat foods.  Salads make symptoms worse.    Denies fever, chills, weight loss or symptoms that awaken her from sleep.  Denies smoking marijuana but occasionally will do edibles.   Past Medical History:  Diagnosis Date   Depression    Diabetes mellitus type I (HCC)    Follows with Endo   Hypertension     Past Surgical History:  Procedure Laterality Date   NO PAST SURGERIES      Current Outpatient Medications  Medication Sig Dispense Refill   amitriptyline (ELAVIL) 10 MG tablet Take 1 tablet (10 mg total) by mouth at bedtime. 30 tablet 1   Erenumab-aooe (AIMOVIG) 140 MG/ML SOAJ Inject 140 mg into the skin every 28 (twenty-eight) days. 1.12 mL 11   glucose blood (ACCU-CHEK GUIDE) test strip 1 each by Other route 3 (three) times daily. Use as instructed 300 each 3   hyoscyamine (LEVSIN SL) 0.125 MG SL tablet Place 1 tablet (0.125 mg total) under  the tongue every 8 (eight) hours as needed. 30 tablet 1   insulin aspart (NOVOLOG FLEXPEN) 100 UNIT/ML FlexPen Max daily 90 units 90 mL 3   insulin degludec (TRESIBA FLEXTOUCH) 100 UNIT/ML FlexTouch Pen Inject 24 Units into the skin daily. 30 mL 3   Insulin Pen Needle 32G X 4 MM MISC 1 Device by Does not apply route in the morning, at noon, in the evening, and at bedtime. 400 each 3   metoCLOPramide (REGLAN) 10 MG tablet Take 1 tablet (10 mg total) by mouth every 6 (six) hours as needed for nausea. 10 tablet 0   naproxen (NAPROSYN) 500 MG tablet Take 1 tablet (500 mg total) by mouth 2 (two) times daily with a meal. 30 tablet 2   omeprazole  (PRILOSEC) 20 MG capsule Take 1 capsule (20 mg total) by mouth daily. Take 30 minutes before breakfast. 30 capsule 1   ondansetron (ZOFRAN) 4 MG tablet Take 1 tablet (4 mg total) by mouth every 8 (eight) hours as needed for nausea or vomiting. 20 tablet 0   promethazine (PHENERGAN) 25 MG suppository Place 1 suppository (25 mg total) rectally every 6 (six) hours as needed for nausea or vomiting. 12 each 0   promethazine (PHENERGAN) 25 MG tablet Take 1 tablet (25 mg total) by mouth every 8 (eight) hours as needed for nausea or vomiting. 20 tablet 0   SUMAtriptan 6 MG/0.5ML SOAJ Inject 6 mg into the skin as needed. May repeat after 1 hour.  Maximum 2 injections in 24 hours. 5 mL 5   No current facility-administered medications for this visit.    Allergies as of 10/06/2022   (No Known Allergies)    Family History  Problem Relation Age of Onset   Anxiety disorder Mother    Depression Mother    Gestational diabetes Mother    Drug abuse Father    Lupus Maternal Grandmother    Hypertension Maternal Grandmother    Alcohol abuse Maternal Grandfather    Depression Maternal Grandfather    Drug abuse Maternal Grandfather    Mental illness Maternal Grandfather    Alcohol abuse Paternal Grandmother    Drug abuse Paternal Grandmother    Diabetes Mellitus II Paternal Grandfather    ADD / ADHD Paternal Grandfather    Diabetes Paternal Grandfather    Drug abuse Paternal Grandfather    Diabetes Mellitus I Maternal Aunt        Maternal great aunt   Diabetes Mellitus II Paternal Uncle    Stomach cancer Neg Hx    Colon cancer Neg Hx    Rectal cancer Neg Hx    Esophageal cancer Neg Hx     Social History   Socioeconomic History   Marital status: Single    Spouse name: Not on file   Number of children: 1   Years of education: Not on file   Highest education level: 12th grade  Occupational History   Not on file  Tobacco Use   Smoking status: Never    Passive exposure: Yes   Smokeless  tobacco: Never  Vaping Use   Vaping status: Former  Substance and Sexual Activity   Alcohol use: Not Currently    Comment: occ   Drug use: No   Sexual activity: Never    Comment: Having periods X 2 years, no periods X 3 mos  Other Topics Concern   Not on file  Social History Narrative   Are you right handed or left handed? R;ight  Are you currently employed ? 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 Roommate and son   What type of home do you live in: 1 story or 2 story? one    Caffeine occ energy drink   Social Determinants of Health   Financial Resource Strain: Low Risk  (05/27/2022)   Overall Financial Resource Strain (CARDIA)    Difficulty of Paying Living Expenses: Not very hard  Food Insecurity: No Food Insecurity (05/27/2022)   Hunger Vital Sign    Worried About Running Out of Food in the Last Year: Never true    Ran Out of Food in the Last Year: Never true  Transportation Needs: No Transportation Needs (05/27/2022)   PRAPARE - Administrator, Civil Service (Medical): No    Lack of Transportation (Non-Medical): No  Physical Activity: Sufficiently Active (05/27/2022)   Exercise Vital Sign    Days of Exercise per Week: 3 days    Minutes of Exercise per Session: 90 min  Stress: Stress Concern Present (05/27/2022)   [employer] of Occupational Health - Occupational Stress Questionnaire    Feeling of Stress : To some extent  Social Connections: Socially Isolated (05/27/2022)   Social Connection and Isolation Panel [NHANES]    Frequency of Communication with Friends and Family: Three times a week    Frequency of Social Gatherings with Friends and Family: Twice a week    Attends Religious Services: Never    Database administrator or Organizations: No    Attends Engineer, structural: Not on file    Marital Status: Never married  Intimate Partner Violence: Unknown (11/12/2021)   Received  from [employer], [facility]   HITS    Physically Hurt: Not on file    Insult or Talk Down To: Not on file    Threaten Physical Harm: Not on file    Scream or Curse: Not on file    Review of Systems:    Constitutional: No weight loss, fever or chills Cardiovascular: No chest pain Respiratory: No SOB  Gastrointestinal: See HPI and otherwise negative   Physical Exam:  Vital signs: BP 110/80   Pulse 94   Ht 5\' 10"  (1.778 m)   Wt 132 lb 9.6 oz (60.1 kg)   LMP 09/13/2022   BMI 19.03 kg/m    Constitutional:   Pleasant AA female appears to be in NAD, Well developed, Well nourished, alert and cooperative Respiratory: Respirations even and unlabored. Lungs clear to auscultation bilaterally.   No wheezes, crackles, or rhonchi.  Cardiovascular: Normal S1, S2. No MRG. Regular rate and rhythm. No peripheral edema, cyanosis or pallor.  Gastrointestinal:  Soft, nondistended, nontender. No rebound or guarding. Normal bowel sounds. No appreciable masses or hepatomegaly. Rectal:  Not performed.  Psychiatric: Demonstrates good judgement and reason without abnormal affect or behaviors.  RELEVANT LABS AND IMAGING: CBC    Component Value Date/Time   WBC 3.8 (L) 09/23/2022 2110   RBC 4.40 09/23/2022 2110   HGB 12.2 09/23/2022 2110   HCT 35.6 (L) 09/23/2022 2110   PLT 248 09/23/2022 2110   MCV 80.9 09/23/2022 2110   MCH 27.7 09/23/2022 2110   MCHC 34.3 09/23/2022 2110   RDW 11.9 09/23/2022 2110   LYMPHSABS 1.1 05/27/2022 1539   MONOABS 0.5 05/27/2022 1539   EOSABS 0.1 05/27/2022 1539   BASOSABS 0.0 05/27/2022 1539    CMP  Component Value Date/Time   NA 138 09/23/2022 2110   K 3.6 09/23/2022 2110   CL 100 09/23/2022 2110   CO2 28 09/23/2022 2110   GLUCOSE 300 (H) 09/23/2022 2110   BUN 6 09/23/2022 2110   CREATININE 0.65 09/23/2022 2110   CALCIUM 9.2 09/23/2022 2110   PROT 7.1 09/23/2022 2110   ALBUMIN 4.1 09/23/2022 2110   AST 25 09/23/2022 2110   ALT 23 09/23/2022 2110    ALKPHOS 51 09/23/2022 2110   BILITOT 0.8 09/23/2022 2110   GFRNONAA >60 09/23/2022 2110   GFRAA NOT CALCULATED 08/17/2013 0945    Assessment: 1.  Nausea and vomiting: 100 times worse around time of cycle, some better with Phenergan suppositories, question of gastroparesis given uncontrolled diabetes, but unable to complete gastric emptying study; concern for gastroparesis worsened by uncontrolled glucose +/- gastritis or outlet obstruction +/- endometriosis 2.  Diabetes: Uncontrolled, has plans to see endocrinologist at the end of October, likely contributing to above  Plan: 1.  Encouraged the patient to follow with her gynecologist, her symptoms are much worse around her cycle, concern for endometriosis.  She might be a good candidate for birth control. 2.  Provided the patient with a handout in regards to gastroparesis diet, foods that are easier to digest and recommended small more frequent meals. 3.  Patient to continue her Phenergan suppositories 25 mg twice daily, she has enough of this at home, she can trial the Hyoscyamine for abdominal cramping and continue Omeprazole 20 mg daily. 4.  Patient unable to finish the gastric emptying study as she vomited the egg about 5 minutes later, she still does not feel like she would be able to complete this study.  Instead of this we will start with an EGD to make sure that there is nothing organically wrong in her stomach causing her symptoms.  She was scheduled with Dr. Lavon Paganini in the [facility].  Did provide the patient a detailed list of risks for the procedure and she agrees to proceed. Patient is appropriate for endoscopic procedure(s) in the ambulatory ([facility]) setting. 5.  Recommend the patient completely abstain from any CBD 6.  Patient to follow in clinic per recommendations after time of EGD.  Hyacinth Meeker, PA-C [facility] 10/06/2022, 1:37 PM  Cc: Sharlene Dory*

## 2022-10-06 NOTE — Patient Instructions (Addendum)
Follow up with endocrinologist and OB/GYN.    You have been scheduled for an endoscopy. Please follow written instructions given to you at your visit today.  If you use inhalers (even only as needed), please bring them with you on the day of your procedure.  If you take any of the following medications, they will need to be adjusted prior to your procedure:   DO NOT TAKE 7 DAYS PRIOR TO TEST- Trulicity (dulaglutide) Ozempic, Wegovy (semaglutide) Mounjaro (tirzepatide) Bydureon Bcise (exanatide extended release)  DO NOT TAKE 1 DAY PRIOR TO YOUR TEST Rybelsus (semaglutide) Adlyxin (lixisenatide) Victoza (liraglutide) Byetta (exanatide) ______________________________________________________________________  _______________________________________________________  If your blood pressure at your visit was 140/90 or greater, please contact your primary care physician to follow up on this.  _______________________________________________________  If you are age 23 or older, your body mass index should be between 23-30. Your Body mass index is 19.03 kg/m. If this is out of the aforementioned range listed, please consider follow up with your Primary Care Provider.  If you are age 76 or younger, your body mass index should be between 19-25. Your Body mass index is 19.03 kg/m. If this is out of the aformentioned range listed, please consider follow up with your Primary Care Provider.   ________________________________________________________  The [facility] providers would like to encourage you to use [facility] to communicate with providers for non-urgent requests or questions.  Due to long hold times on the telephone, sending your provider a message by [facility] may be a faster and more efficient way to get a response.  Please allow 48 business hours for a response.  Please remember that this is for non-urgent requests.  _______________________________________________________

## 2022-10-07 ENCOUNTER — Encounter: Payer: Self-pay | Admitting: Gastroenterology

## 2022-10-13 ENCOUNTER — Encounter: Payer: Self-pay | Admitting: Gastroenterology

## 2022-10-13 ENCOUNTER — Ambulatory Visit (AMBULATORY_SURGERY_CENTER): Payer: Managed Care, Other (non HMO) | Admitting: Gastroenterology

## 2022-10-13 VITALS — BP 119/68 | HR 92 | Temp 97.3°F | Resp 17 | Ht 70.0 in | Wt 132.0 lb

## 2022-10-13 DIAGNOSIS — K2100 Gastro-esophageal reflux disease with esophagitis, without bleeding: Secondary | ICD-10-CM

## 2022-10-13 DIAGNOSIS — R112 Nausea with vomiting, unspecified: Secondary | ICD-10-CM

## 2022-10-13 MED ORDER — SODIUM CHLORIDE 0.9 % IV SOLN: 500.0000 mL | Freq: Once | INTRAVENOUS | Status: DC

## 2022-10-13 MED ORDER — OMEPRAZOLE 20 MG PO CPDR
20.0000 mg | DELAYED_RELEASE_CAPSULE | Freq: Every day | ORAL | 3 refills | Status: DC
Start: 2022-10-13 — End: 2023-09-22

## 2022-10-13 NOTE — Progress Notes (Signed)
[facility] History and Physical   Primary Care Physician:  Sharlene Dory, DO   Reason for Procedure:  Nausea and vomiting  Plan:    EGD with possible interventions as needed     HPI: Tina Fry is a very pleasant 22 y.o. female here for EGD for evaluation nausea and vomiting   The risks and benefits as well as alternatives of endoscopic procedure(s) have been discussed and reviewed. All questions answered. The patient agrees to proceed.    Past Medical History:  Diagnosis Date   Depression    Diabetes mellitus type I (HCC)    Follows with Endo   Hypertension     Past Surgical History:  Procedure Laterality Date   NO PAST SURGERIES      Prior to Admission medications   Medication Sig Start Date End Date Taking? Authorizing Provider  glucose blood (ACCU-CHEK GUIDE) test strip 1 each by Other route 3 (three) times daily. Use as instructed 06/09/22  Yes Shamleffer, Konrad Dolores, MD  hyoscyamine (LEVSIN SL) 0.125 MG SL tablet Place 1 tablet (0.125 mg total) under the tongue every 8 (eight) hours as needed. 08/31/22  Yes Arnaldo Natal, NP  insulin aspart (NOVOLOG FLEXPEN) 100 UNIT/ML FlexPen Max daily 90 units 06/09/22  Yes Shamleffer, Konrad Dolores, MD  insulin degludec (TRESIBA FLEXTOUCH) 100 UNIT/ML FlexTouch Pen Inject 24 Units into the skin daily. 06/09/22  Yes Shamleffer, Konrad Dolores, MD  Insulin Pen Needle 32G X 4 MM MISC 1 Device by Does not apply route in the morning, at noon, in the evening, and at bedtime. 06/09/22  Yes Shamleffer, Konrad Dolores, MD  promethazine (PHENERGAN) 25 MG suppository Place 1 suppository (25 mg total) rectally every 6 (six) hours as needed for nausea or vomiting. 09/23/22  Yes Countryman, Chase, MD  amitriptyline (ELAVIL) 10 MG tablet Take 1 tablet (10 mg total) by mouth at bedtime. 07/29/22   Wendling, Jilda Roche, DO  Erenumab-aooe (AIMOVIG) 140 MG/ML SOAJ Inject 140 mg into the skin every 28  (twenty-eight) days. 03/06/22   Drema Dallas, DO  metoCLOPramide (REGLAN) 10 MG tablet Take 1 tablet (10 mg total) by mouth every 6 (six) hours as needed for nausea. Patient not taking: Reported on 10/13/2022 09/18/22   Achille Rich, PA-C  naproxen (NAPROSYN) 500 MG tablet Take 1 tablet (500 mg total) by mouth 2 (two) times daily with a meal. 01/14/22   Wendling, Jilda Roche, DO  omeprazole (PRILOSEC) 20 MG capsule Take 1 capsule (20 mg total) by mouth daily. Take 30 minutes before breakfast. Patient not taking: Reported on 10/13/2022 08/31/22   Arnaldo Natal, NP  ondansetron (ZOFRAN) 4 MG tablet Take 1 tablet (4 mg total) by mouth every 8 (eight) hours as needed for nausea or vomiting. 05/27/22   Saguier, Ramon Dredge, PA-C  promethazine (PHENERGAN) 25 MG tablet Take 1 tablet (25 mg total) by mouth every 8 (eight) hours as needed for nausea or vomiting. 08/26/22   Sharlene Dory, DO  SUMAtriptan 6 MG/0.5ML SOAJ Inject 6 mg into the skin as needed. May repeat after 1 hour.  Maximum 2 injections in 24 hours. 03/06/22   Drema Dallas, DO    Current Outpatient Medications  Medication Sig Dispense Refill   glucose blood (ACCU-CHEK GUIDE) test strip 1 each by Other route 3 (three) times daily. Use as instructed 300 each 3   hyoscyamine (LEVSIN SL) 0.125 MG SL tablet Place 1 tablet (0.125 mg total) under the tongue every  8 (eight) hours as needed. 30 tablet 1   insulin aspart (NOVOLOG FLEXPEN) 100 UNIT/ML FlexPen Max daily 90 units 90 mL 3   insulin degludec (TRESIBA FLEXTOUCH) 100 UNIT/ML FlexTouch Pen Inject 24 Units into the skin daily. 30 mL 3   Insulin Pen Needle 32G X 4 MM MISC 1 Device by Does not apply route in the morning, at noon, in the evening, and at bedtime. 400 each 3   promethazine (PHENERGAN) 25 MG suppository Place 1 suppository (25 mg total) rectally every 6 (six) hours as needed for nausea or vomiting. 12 each 0   amitriptyline (ELAVIL) 10 MG tablet Take 1 tablet (10 mg total)  by mouth at bedtime. 30 tablet 1   Erenumab-aooe (AIMOVIG) 140 MG/ML SOAJ Inject 140 mg into the skin every 28 (twenty-eight) days. 1.12 mL 11   metoCLOPramide (REGLAN) 10 MG tablet Take 1 tablet (10 mg total) by mouth every 6 (six) hours as needed for nausea. (Patient not taking: Reported on 10/13/2022) 10 tablet 0   naproxen (NAPROSYN) 500 MG tablet Take 1 tablet (500 mg total) by mouth 2 (two) times daily with a meal. 30 tablet 2   omeprazole (PRILOSEC) 20 MG capsule Take 1 capsule (20 mg total) by mouth daily. Take 30 minutes before breakfast. (Patient not taking: Reported on 10/13/2022) 30 capsule 1   ondansetron (ZOFRAN) 4 MG tablet Take 1 tablet (4 mg total) by mouth every 8 (eight) hours as needed for nausea or vomiting. 20 tablet 0   promethazine (PHENERGAN) 25 MG tablet Take 1 tablet (25 mg total) by mouth every 8 (eight) hours as needed for nausea or vomiting. 20 tablet 0   SUMAtriptan 6 MG/0.5ML SOAJ Inject 6 mg into the skin as needed. May repeat after 1 hour.  Maximum 2 injections in 24 hours. 5 mL 5   Current Facility-Administered Medications  Medication Dose Route Frequency Provider Last Rate Last Admin   0.9 %  sodium chloride infusion  500 mL Intravenous Once Napoleon Form, MD        Allergies as of 10/13/2022   (No Known Allergies)    Family History  Problem Relation Age of Onset   Anxiety disorder Mother    Depression Mother    Gestational diabetes Mother    Drug abuse Father    Lupus Maternal Grandmother    Hypertension Maternal Grandmother    Alcohol abuse Maternal Grandfather    Depression Maternal Grandfather    Drug abuse Maternal Grandfather    Mental illness Maternal Grandfather    Alcohol abuse Paternal Grandmother    Drug abuse Paternal Grandmother    Diabetes Mellitus II Paternal Grandfather    ADD / ADHD Paternal Grandfather    Diabetes Paternal Grandfather    Drug abuse Paternal Grandfather    Diabetes Mellitus I Maternal Aunt        Maternal  great aunt   Diabetes Mellitus II Paternal Uncle    Stomach cancer Neg Hx    Colon cancer Neg Hx    Rectal cancer Neg Hx    Esophageal cancer Neg Hx     Social History   Socioeconomic History   Marital status: Single    Spouse name: Not on file   Number of children: 1   Years of education: Not on file   Highest education level: 12th grade  Occupational History   Not on file  Tobacco Use   Smoking status: Never    Passive exposure: Yes  Smokeless tobacco: Never  Vaping Use   Vaping status: Former  Substance and Sexual Activity   Alcohol use: Not Currently    Comment: occ   Drug use: Yes    Types: Marijuana    Comment: once a month   Sexual activity: Not Currently    Comment: Having periods X 2 years, no periods X 3 mos  Other Topics Concern   Not on file  Social History Narrative   Are you right handed or left handed? R;ight   Are you currently employed ? 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 Roommate and son   What type of home do you live in: 1 story or 2 story? one    Caffeine occ energy drink   Social Determinants of Health   Financial Resource Strain: Low Risk  (05/27/2022)   Overall Financial Resource Strain (CARDIA)    Difficulty of Paying Living Expenses: Not very hard  Food Insecurity: No Food Insecurity (05/27/2022)   Hunger Vital Sign    Worried About Running Out of Food in the Last Year: Never true    Ran Out of Food in the Last Year: Never true  Transportation Needs: No Transportation Needs (05/27/2022)   PRAPARE - Administrator, Civil Service (Medical): No    Lack of Transportation (Non-Medical): No  Physical Activity: Sufficiently Active (05/27/2022)   Exercise Vital Sign    Days of Exercise per Week: 3 days    Minutes of Exercise per Session: 90 min  Stress: Stress Concern Present (05/27/2022)   [employer] of Occupational Health - Occupational Stress Questionnaire     Feeling of Stress : To some extent  Social Connections: Socially Isolated (05/27/2022)   Social Connection and Isolation Panel [NHANES]    Frequency of Communication with Friends and Family: Three times a week    Frequency of Social Gatherings with Friends and Family: Twice a week    Attends Religious Services: Never    Database administrator or Organizations: No    Attends Engineer, structural: Not on file    Marital Status: Never married  Intimate Partner Violence: Unknown (11/12/2021)   Received from [employer], [facility]   HITS    Physically Hurt: Not on file    Insult or Talk Down To: Not on file    Threaten Physical Harm: Not on file    Scream or Curse: Not on file    Review of Systems:  All other review of systems negative except as mentioned in the HPI.  Physical Exam: Vital signs in last 24 hours: BP 127/79   Pulse 88   Temp (!) 97.3 F (36.3 C)   Ht 5\' 10"  (1.778 m)   Wt 132 lb (59.9 kg)   LMP 09/13/2022 (Exact Date)   SpO2 100%   BMI 18.94 kg/m  General:   Alert, NAD Lungs:  Clear .   Heart:  Regular rate and rhythm Abdomen:  Soft, nontender and nondistended. Neuro/Psych:  Alert and cooperative. Normal mood and affect. A and O x 3  Reviewed labs, radiology imaging, old records and pertinent past GI work up  Patient is appropriate for planned procedure(s) and anesthesia in an ambulatory setting   K. Scherry Ran , MD [phone]

## 2022-10-13 NOTE — Patient Instructions (Addendum)
Continue present medications. Follow an antireflux regimen. Continue Omeprazole 20mg  daily No high dose aspirin, ibuprofen, naproxen, or other non-steroidal anti-inflammatory drugs. Abstain from Marijuana use   YOU HAD AN ENDOSCOPIC PROCEDURE TODAY AT THE [facility]:   Refer to the procedure report that was given to you for any specific questions about what was found during the examination.  If the procedure report does not answer your questions, please call your gastroenterologist to clarify.  If you requested that your care partner not be given the details of your procedure findings, then the procedure report has been included in a sealed envelope for you to review at your convenience later.  YOU SHOULD EXPECT: Some feelings of bloating in the abdomen. Passage of more gas than usual.  Walking can help get rid of the air that was put into your GI tract during the procedure and reduce the bloating.   Please Note:  You might notice some irritation and congestion in your nose or some drainage.  This is from the oxygen used during your procedure.  There is no need for concern and it should clear up in a day or so.  SYMPTOMS TO REPORT IMMEDIATELY:  Following upper endoscopy (EGD)  Vomiting of blood or coffee ground material  New chest pain or pain under the shoulder blades  Painful or persistently difficult swallowing  New shortness of breath  Fever of 100F or higher  Black, tarry-looking stools  For urgent or emergent issues, a gastroenterologist can be reached at any hour by calling [phone]. Do not use MyChart messaging for urgent concerns.    DIET:  We do recommend a small meal at first, but then you may proceed to your regular diet.  Drink plenty of fluids but you should avoid alcoholic beverages for 24 hours.  ACTIVITY:  You should plan to take it easy for the rest of today and you should NOT DRIVE or use heavy machinery until tomorrow (because of the sedation  medicines used during the test).    FOLLOW UP: Our staff will call the number listed on your records the next business day following your procedure.  We will call around 7:15- 8:00 am to check on you and address any questions or concerns that you may have regarding the information given to you following your procedure. If we do not reach you, we will leave a message.      SIGNATURES/CONFIDENTIALITY: You and/or your care partner have signed paperwork which will be entered into your electronic medical record.  These signatures attest to the fact that that the information above on your After Visit Summary has been reviewed and is understood.  Full responsibility of the confidentiality of this discharge information lies with you and/or your care-partner.

## 2022-10-13 NOTE — Progress Notes (Signed)
Pt's states no medical or surgical changes since previsit or office visit. 

## 2022-10-13 NOTE — Progress Notes (Signed)
To pacu, VSS. Report to [email or web address].tb 

## 2022-10-13 NOTE — Op Note (Signed)
[facility] Patient Name: Tina Fry Procedure Date: 10/13/2022 1:41 PM MRN: [id number] Endoscopist: Napoleon Form , MD, [id number] Age: 22 Referring MD:  Date of Birth: 07-21-2000 Gender: Female Account #: [id number] Procedure:                Upper GI endoscopy Indications:              Persistent vomiting of unknown cause Medicines:                Monitored Anesthesia Care Procedure:                Pre-Anesthesia Assessment:                           - Prior to the procedure, a History and Physical                            was performed, and patient medications and                            allergies were reviewed. The patient's tolerance of                            previous anesthesia was also reviewed. The risks                            and benefits of the procedure and the sedation                            options and risks were discussed with the patient.                            All questions were answered, and informed consent                            was obtained. Prior Anticoagulants: The patient has                            taken no anticoagulant or antiplatelet agents. ASA                            Grade Assessment: II - A patient with mild systemic                            disease. After reviewing the risks and benefits,                            the patient was deemed in satisfactory condition to                            undergo the procedure.                           After obtaining informed consent, the endoscope was  passed under direct vision. Throughout the                            procedure, the patient's blood pressure, pulse, and                            oxygen saturations were monitored continuously. The                            GIF [id number] #[id number] was introduced through the                            mouth, and advanced to the second part of duodenum.                            The upper GI  endoscopy was accomplished without                            difficulty. The patient tolerated the procedure                            well. Scope In: Scope Out: Findings:                 The Z-line was regular and was found 40 cm from the                            incisors.                           LA Grade B (one or more mucosal breaks greater than                            5 mm, not extending between the tops of two mucosal                            folds) esophagitis with no bleeding was found 38 to                            40 cm from the incisors.                           The cardia and gastric fundus were normal on                            retroflexion.                           The stomach was normal.                           The examined duodenum was normal. Complications:            No immediate complications. Estimated Blood Loss:     Estimated blood loss was minimal. Impression:               -  Z-line regular, 40 cm from the incisors.                           - LA Grade B reflux esophagitis with no bleeding.                           - Normal stomach.                           - Normal examined duodenum.                           - No specimens collected. Recommendation:           - Patient has a contact number available for                            emergencies. The signs and symptoms of potential                            delayed complications were discussed with the                            patient. Return to normal activities tomorrow.                            Written discharge instructions were provided to the                            patient.                           - Resume previous diet.                           - Continue present medications.                           - Follow an antireflux regimen.                           - Continue Omeprazole 20mg  daily                           - No high dose aspirin, ibuprofen, naproxen, or                             other non-steroidal anti-inflammatory drugs.                           - Abstain from Marijuana use Napoleon Form, MD 10/13/2022 1:59:19 PM This report has been signed electronically.

## 2022-10-13 NOTE — Progress Notes (Signed)
Omeprazole 20 mg, #90, #3 refills VO Dr. Moise Boring RN

## 2022-10-14 ENCOUNTER — Telehealth: Payer: Self-pay

## 2022-10-14 NOTE — Telephone Encounter (Signed)
  Follow up Call-     10/13/2022   12:42 PM  Call back number  Post procedure Call Back phone  # [phone]  Permission to leave phone message Yes     Patient questions:  Do you have a fever, pain , or abdominal swelling? No. Pain Score  0 *  Have you tolerated food without any problems? Yes.    Have you been able to return to your normal activities? Yes.    Do you have any questions about your discharge instructions: Diet   No. Medications  No. Follow up visit  No.  Do you have questions or concerns about your Care? No.  Actions: * If pain score is 4 or above: No action needed, pain <4.

## 2022-11-04 ENCOUNTER — Ambulatory Visit: Payer: Managed Care, Other (non HMO) | Admitting: Internal Medicine

## 2022-11-04 NOTE — Progress Notes (Deleted)
Name: Tina Fry  MRN/ DOB: [id number], 2000/11/01   Age/ Sex: 22 y.o., female    PCP: Sharlene Dory, DO   Reason for Endocrinology Evaluation: Type 1 Diabetes Mellitus     Date of Initial Endocrinology Visit: 09/01/2021    PATIENT IDENTIFIER: [name] is a 22 y.o. female with a past medical history of T1DM, migraine headaches. The patient presented for initial endocrinology clinic visit on 09/01/2021  for consultative assistance with her diabetes management.    HPI: [name] was    Diagnosed with DM at age 57  Prior Medications tried/Intolerance: Metformin-  no intolerance . Was on insulin pump in high school but stopped due to cost                 Hemoglobin A1c has ranged from 12.6% in 2015, peaking at >15.0% in 2019. Patient has required hospitalization: 12/2020. Last DKA 2016   S/P vaginal deliver 05/2019   On her initial visit to our clinic her A1c 14.0%, she was on Novolog only. We started basal insulin and provided her with correction scale as well    SUBJECTIVE:   During the last visit (06/09/2022): A1c 13.2%  Today (11/04/22): Tina Fry is here for follow-up on diabetes management.  She checks herblood sugars occasionally   She continues with frequent ED visit due to abdominal pain or nausea, that has been attributed to gastroparesis per GI. She had a follow-up with GI 10/02/2022   Works third shift 7pm to 5:30 AM , sleep 7 am till noon  Has nausea that she attributes from Abx  Denies constipation or diarrhea  She snacks during the day , on cucumbers and vegetables        HOME DIABETES REGIMEN: Tresiba  24 units daily  Novolog 22 units TID QAC  CF: Novolog (BG-130/40)      Statin: no ACE-I/ARB: no    METER DOWNLOAD SUMMARY: did not bring     DIABETIC COMPLICATIONS: Microvascular complications:   Denies: CKD , retinopathy, neuropathy  Last eye exam: Completed 2022  Macrovascular complications:   Denies:  CAD, PVD, CVA   PAST HISTORY: Past Medical History:  Past Medical History:  Diagnosis Date   Depression    Diabetes mellitus type I (HCC)    Follows with Endo   Hypertension    Past Surgical History:  Past Surgical History:  Procedure Laterality Date   NO PAST SURGERIES      Social History:  reports that she has never smoked. She has been exposed to tobacco smoke. She has never used smokeless tobacco. She reports that she does not currently use alcohol. She reports current drug use. Drug: Marijuana. Family History:  Family History  Problem Relation Age of Onset   Anxiety disorder Mother    Depression Mother    Gestational diabetes Mother    Drug abuse Father    Lupus Maternal Grandmother    Hypertension Maternal Grandmother    Alcohol abuse Maternal Grandfather    Depression Maternal Grandfather    Drug abuse Maternal Grandfather    Mental illness Maternal Grandfather    Alcohol abuse Paternal Grandmother    Drug abuse Paternal Grandmother    Diabetes Mellitus II Paternal Grandfather    ADD / ADHD Paternal Grandfather    Diabetes Paternal Grandfather    Drug abuse Paternal Grandfather    Diabetes Mellitus I Maternal Aunt        Maternal great aunt   Diabetes  Mellitus II Paternal Uncle    Stomach cancer Neg Hx    Colon cancer Neg Hx    Rectal cancer Neg Hx    Esophageal cancer Neg Hx      HOME MEDICATIONS: Allergies as of 11/04/2022   No Known Allergies      Medication List        Accurate as of November 04, 2022  7:13 AM. If you have any questions, ask your nurse or doctor.          Accu-Chek Guide test strip Generic drug: glucose blood 1 each by Other route 3 (three) times daily. Use as instructed   Aimovig 140 MG/ML Soaj Generic drug: Erenumab-aooe Inject 140 mg into the skin every 28 (twenty-eight) days.   amitriptyline 10 MG tablet Commonly known as: ELAVIL Take 1 tablet (10 mg total) by mouth at bedtime.   hyoscyamine 0.125 MG SL  tablet Commonly known as: LEVSIN SL Place 1 tablet (0.125 mg total) under the tongue every 8 (eight) hours as needed.   Insulin Pen Needle 32G X 4 MM Misc 1 Device by Does not apply route in the morning, at noon, in the evening, and at bedtime.   metoCLOPramide 10 MG tablet Commonly known as: REGLAN Take 1 tablet (10 mg total) by mouth every 6 (six) hours as needed for nausea.   naproxen 500 MG tablet Commonly known as: NAPROSYN Take 1 tablet (500 mg total) by mouth 2 (two) times daily with a meal.   NovoLOG FlexPen 100 UNIT/ML FlexPen Generic drug: insulin aspart Max daily 90 units   omeprazole 20 MG capsule Commonly known as: PRILOSEC Take 1 capsule (20 mg total) by mouth daily. Take 30 minutes before breakfast.   omeprazole 20 MG capsule Commonly known as: PRILOSEC Take 1 capsule (20 mg total) by mouth daily.   ondansetron 4 MG tablet Commonly known as: Zofran Take 1 tablet (4 mg total) by mouth every 8 (eight) hours as needed for nausea or vomiting.   promethazine 25 MG tablet Commonly known as: PHENERGAN Take 1 tablet (25 mg total) by mouth every 8 (eight) hours as needed for nausea or vomiting.   promethazine 25 MG suppository Commonly known as: PHENERGAN Place 1 suppository (25 mg total) rectally every 6 (six) hours as needed for nausea or vomiting.   SUMAtriptan 6 MG/0.5ML Soaj Inject 6 mg into the skin as needed. May repeat after 1 hour.  Maximum 2 injections in 24 hours.   Evaristo Bury FlexTouch 100 UNIT/ML FlexTouch Pen Generic drug: insulin degludec Inject 24 Units into the skin daily.         ALLERGIES: No Known Allergies   REVIEW OF SYSTEMS: A comprehensive ROS was conducted with the patient and is negative except as per HPI    OBJECTIVE:   VITAL SIGNS: LMP 09/13/2022 (Exact Date)    PHYSICAL EXAM:  General: Pt appears well and is in NAD  Neck: General: Supple without adenopathy or carotid bruits. Thyroid: Thyroid size normal.  No goiter or  nodules appreciated.   Lungs: Clear with good BS bilat   Heart: RRR  Abdomen: Soft, nontender  Extremities:  Lower extremities - No pretibial edema  Neuro: MS is good with appropriate affect, pt is alert and Ox3    DM foot exam: 06/09/2022  The skin of the feet is intact without sores or ulcerations. The pedal pulses are 2+ on right and 2+ on left. The sensation is intact to a screening 5.07, 10 gram monofilament bilaterally  DATA REVIEWED:  Lab Results  Component Value Date   HGBA1C 13.2 (A) 06/09/2022   HGBA1C >15.0 01/23/2022   HGBA1C >15.0 09/01/2021    Latest Reference Range & Units 05/27/22 15:39  Sodium 135 - 145 mEq/L 138  Potassium 3.5 - 5.1 mEq/L 4.0  Chloride 96 - 112 mEq/L 100  CO2 19 - 32 mEq/L 30  Glucose 70 - 99 mg/dL 161 (H)  BUN 6 - 23 mg/dL 8  Creatinine 0.96 - 0.45 mg/dL 4.09  Calcium 8.4 - 81.1 mg/dL 9.8  Alkaline Phosphatase 39 - 117 U/L 53  Albumin 3.5 - 5.2 g/dL 4.5  Lipase 91.4 - 78.2 U/L 25.0  AST 0 - 37 U/L 10  ALT 0 - 35 U/L 10  Total Protein 6.0 - 8.3 g/dL 7.1  Total Bilirubin 0.2 - 1.2 mg/dL 0.4  GFR >95.62 mL/min 114.82  (H): Data is abnormally high  ASSESSMENT / PLAN / RECOMMENDATIONS:   1) Type 1 Diabetes Mellitus, poorly controlled, Without complications - Most recent A1c of 13.2%. Goal A1c < 7.0 %.    -Unfortunately the patient continues with hyperglycemia.  A1c has trended down from >15.0% to 13.2% -Historically she had issues taking Basaglar daily, she has been working on this over the past 6 weeks, I will switch Hospital doctor to [place] as it has a longer Fry-life - CGM and dexcom are costprohibitve - Tandem and Omnipod cost prohibitive  - Has been using mother's glucose meter, I have prescribed accu chek guide in January,2024 but the pt states the pharmacy did not give it to her, today she was provided with Accu-Chek glucose meter and extra strips sent to the pharmacy  - Fasting BG this a.m. 145 mg/dL , would not change Basaglar  dose - Will increase NovoLog based on memory recall of a prelunch BG 215 mg/DL, will increase NovoLog based on that statement -Patient advised to use the correction scale before each meal and bedtime   MEDICATIONS: Switch Basaglar to [place] 24 units daily  Increase Novolog 22 units TIDQAC Start Correction Scale :Novolog  (BG-130/40)      EDUCATION / INSTRUCTIONS: BG monitoring instructions: Patient is instructed to check her blood sugars 3 times a day, before meals. Call [facility] if: BG persistently < 70  I reviewed the Rule of 15 for the treatment of hypoglycemia in detail with the patient. Literature supplied.   2) Diabetic complications:  Eye: Does not have known diabetic retinopathy.  Neuro/ Feet: Does not have known diabetic peripheral neuropathy. Renal: Patient does not have known baseline CKD. She is not on an ACEI/ARB at present.     Follow-up in 4 months  Signed electronically by: Lyndle Herrlich, MD  [facility]  [facility] [address] [place] [place], [place] [address] Phone: [phone] FAX: [phone]   CC: Sharlene Dory, DO [address] [place] [address] Phone: [phone]  Fax: [phone]    Return to Endocrinology clinic as below: Future Appointments  Date Time Provider Department Center  11/04/2022  9:10 AM Alyzza Andringa, Konrad Dolores, MD [facility] None

## 2022-11-05 ENCOUNTER — Encounter: Payer: Self-pay | Admitting: Family Medicine

## 2022-11-05 DIAGNOSIS — Z3009 Encounter for other general counseling and advice on contraception: Secondary | ICD-10-CM

## 2022-11-05 NOTE — Telephone Encounter (Signed)
She was referred and seen by GI. Did you want to refer to OBGYN per patient request?

## 2022-11-10 ENCOUNTER — Ambulatory Visit: Payer: Managed Care, Other (non HMO) | Admitting: Internal Medicine

## 2022-12-14 ENCOUNTER — Encounter: Payer: Self-pay | Admitting: Family Medicine

## 2023-01-09 ENCOUNTER — Ambulatory Visit
Admission: EM | Admit: 2023-01-09 | Discharge: 2023-01-09 | Disposition: A | Discharge status: Home / Self Care | Payer: Managed Care, Other (non HMO) | Attending: Internal Medicine | Admitting: Internal Medicine

## 2023-01-09 ENCOUNTER — Emergency Department (HOSPITAL_BASED_OUTPATIENT_CLINIC_OR_DEPARTMENT_OTHER)
Admission: EM | Admit: 2023-01-09 | Discharge: 2023-01-09 | Disposition: A | Discharge status: Home / Self Care | Payer: Managed Care, Other (non HMO) | Attending: Emergency Medicine | Admitting: Emergency Medicine

## 2023-01-09 ENCOUNTER — Other Ambulatory Visit: Payer: Self-pay

## 2023-01-09 ENCOUNTER — Emergency Department (HOSPITAL_BASED_OUTPATIENT_CLINIC_OR_DEPARTMENT_OTHER): Payer: Managed Care, Other (non HMO)

## 2023-01-09 ENCOUNTER — Encounter (HOSPITAL_BASED_OUTPATIENT_CLINIC_OR_DEPARTMENT_OTHER): Payer: Self-pay

## 2023-01-09 DIAGNOSIS — E1065 Type 1 diabetes mellitus with hyperglycemia: Secondary | ICD-10-CM | POA: Insufficient documentation

## 2023-01-09 DIAGNOSIS — Z20822 Contact with and (suspected) exposure to covid-19: Secondary | ICD-10-CM | POA: Diagnosis not present

## 2023-01-09 DIAGNOSIS — E1069 Type 1 diabetes mellitus with other specified complication: Secondary | ICD-10-CM

## 2023-01-09 DIAGNOSIS — R0602 Shortness of breath: Secondary | ICD-10-CM

## 2023-01-09 DIAGNOSIS — Z794 Long term (current) use of insulin: Secondary | ICD-10-CM | POA: Insufficient documentation

## 2023-01-09 DIAGNOSIS — R34 Anuria and oliguria: Secondary | ICD-10-CM

## 2023-01-09 DIAGNOSIS — R1084 Generalized abdominal pain: Secondary | ICD-10-CM

## 2023-01-09 DIAGNOSIS — I10 Essential (primary) hypertension: Secondary | ICD-10-CM | POA: Diagnosis not present

## 2023-01-09 DIAGNOSIS — R739 Hyperglycemia, unspecified: Secondary | ICD-10-CM

## 2023-01-09 DIAGNOSIS — R519 Headache, unspecified: Secondary | ICD-10-CM

## 2023-01-09 DIAGNOSIS — E1043 Type 1 diabetes mellitus with diabetic autonomic (poly)neuropathy: Secondary | ICD-10-CM | POA: Insufficient documentation

## 2023-01-09 DIAGNOSIS — R109 Unspecified abdominal pain: Secondary | ICD-10-CM | POA: Diagnosis present

## 2023-01-09 DIAGNOSIS — R112 Nausea with vomiting, unspecified: Secondary | ICD-10-CM

## 2023-01-09 DIAGNOSIS — Z79899 Other long term (current) drug therapy: Secondary | ICD-10-CM | POA: Diagnosis not present

## 2023-01-09 DIAGNOSIS — K3184 Gastroparesis: Secondary | ICD-10-CM

## 2023-01-09 LAB — URINALYSIS, ROUTINE W REFLEX MICROSCOPIC
Glucose, UA: >=500 mg/dL — AB
Ketones, ur: 15 mg/dL — AB
Leukocytes,Ua: NEGATIVE
Nitrite: NEGATIVE
Protein, ur: 30 mg/dL — AB
Specific Gravity, Urine: 1.02 (ref 1.005–1.030)
pH: 5.5 (ref 5.0–8.0)

## 2023-01-09 LAB — COMPREHENSIVE METABOLIC PANEL
ALT: 15 U/L (ref 0–44)
AST: 15 U/L (ref 15–41)
Albumin: 4.4 g/dL (ref 3.5–5.0)
Alkaline Phosphatase: 61 U/L (ref 38–126)
Anion gap: 13 (ref 5–15)
BUN: 16 mg/dL (ref 6–20)
CO2: 27 mmol/L (ref 22–32)
Calcium: 10 mg/dL (ref 8.9–10.3)
Chloride: 92 mmol/L — ABNORMAL LOW (ref 98–111)
Creatinine, Ser: 0.8 mg/dL (ref 0.44–1.00)
GFR, Estimated: >60 mL/min (ref >60)
Glucose, Bld: 367 mg/dL — ABNORMAL HIGH (ref 70–99)
Potassium: 3.3 mmol/L — ABNORMAL LOW (ref 3.5–5.1)
Sodium: 132 mmol/L — ABNORMAL LOW (ref 135–145)
Total Bilirubin: 1 mg/dL (ref <1.2)
Total Protein: 8 g/dL (ref 6.5–8.1)

## 2023-01-09 LAB — LIPASE, BLOOD: Lipase: 29 U/L (ref 11–51)

## 2023-01-09 LAB — RESP PANEL BY RT-PCR (RSV, FLU A&B, COVID)  RVPGX2
Influenza A by PCR: NEGATIVE
Influenza B by PCR: NEGATIVE
Resp Syncytial Virus by PCR: NEGATIVE
SARS Coronavirus 2 by RT PCR: NEGATIVE

## 2023-01-09 LAB — TROPONIN I (HIGH SENSITIVITY)
Troponin I (High Sensitivity): 3 ng/L (ref <18)
Troponin I (High Sensitivity): 3 ng/L (ref <18)

## 2023-01-09 LAB — CBC
HCT: 42.3 % (ref 36.0–46.0)
Hemoglobin: 14.8 g/dL (ref 12.0–15.0)
MCH: 28.2 pg (ref 26.0–34.0)
MCHC: 35 g/dL (ref 30.0–36.0)
MCV: 80.6 fL (ref 80.0–100.0)
Platelets: 264 10*3/uL (ref 150–400)
RBC: 5.25 MIL/uL — ABNORMAL HIGH (ref 3.87–5.11)
RDW: 12.2 % (ref 11.5–15.5)
WBC: 5 10*3/uL (ref 4.0–10.5)
nRBC: 0 % (ref 0.0–0.2)

## 2023-01-09 LAB — POCT FASTING CBG KUC MANUAL ENTRY: POCT Glucose (KUC): 345 mg/dL — AB (ref 70–99)

## 2023-01-09 LAB — D-DIMER, QUANTITATIVE: D-Dimer, Quant: <0.27 ug{FEU}/mL (ref 0.00–0.50)

## 2023-01-09 LAB — PREGNANCY, URINE: Preg Test, Ur: NEGATIVE

## 2023-01-09 LAB — URINALYSIS, MICROSCOPIC (REFLEX)

## 2023-01-09 MED ORDER — DROPERIDOL 2.5 MG/ML IJ SOLN
2.5000 mg | Freq: Once | INTRAMUSCULAR | Status: AC
  Administered 2023-01-09: 2.5 mg via INTRAVENOUS
  Filled 2023-01-09: qty 2

## 2023-01-09 MED ORDER — SODIUM CHLORIDE 0.9 % IV BOLUS
1000.0000 mL | Freq: Once | INTRAVENOUS | Status: AC
  Administered 2023-01-09: 1000 mL via INTRAVENOUS

## 2023-01-09 MED ORDER — SODIUM CHLORIDE 0.9 % IV SOLN: INTRAVENOUS | Status: DC

## 2023-01-09 MED ORDER — IOHEXOL 300 MG/ML  SOLN
75.0000 mL | Freq: Once | INTRAMUSCULAR | Status: AC | PRN
  Administered 2023-01-09: 75 mL via INTRAVENOUS

## 2023-01-09 MED ORDER — ONDANSETRON HCL 4 MG/2ML IJ SOLN
4.0000 mg | Freq: Once | INTRAMUSCULAR | Status: AC
  Administered 2023-01-09: 4 mg via INTRAVENOUS
  Filled 2023-01-09: qty 2

## 2023-01-09 MED ORDER — DIPHENHYDRAMINE HCL 50 MG/ML IJ SOLN
25.0000 mg | Freq: Once | INTRAMUSCULAR | Status: AC
  Administered 2023-01-09: 25 mg via INTRAVENOUS
  Filled 2023-01-09: qty 1

## 2023-01-09 NOTE — ED Provider Notes (Addendum)
[place] [facility] AT [facility] Provider Note   CSN: [id number] Arrival date & time: 01/09/23  1253     History  Chief Complaint  Patient presents with   Chest Pain   Shortness of Breath    Tina Fry is a 22 y.o. female.  Patient referred in from urgent care.  Patient is a type I diabetic.  Suffers with chronic abdominal pain and bouts of nausea and vomiting.  Could be secondary to gastroparesis.  Has seen GI in the past and is followed by endocrine.  Her blood sugars tend to be on the high side usually in the 300s.  Patient states that the abdominal pain intermittent but has been worse for the past 4 days.  She had been struggling with this since April.  She has had several episodes of vomiting for the past 5 days.  Associated with chest pain and exertional shortness of breath.  Temp here 97.6 pulse 81 respirations 14 blood pressure 121/97 oxygen sats 96% on room air.  Patient seen twice in September for abdominal pain nausea and vomiting.  Treated as if it could be gastroparesis.  Past medical history number depression diabetes and hypertension.  Patient states that she does use marijuana once a month.  Does not use tobacco products.  Patient denies any vomiting of blood.  The abdominal pain is kind of all over.       Home Medications Prior to Admission medications   Medication Sig Start Date End Date Taking? Authorizing Provider  amitriptyline (ELAVIL) 10 MG tablet Take 1 tablet (10 mg total) by mouth at bedtime. 07/29/22   Wendling, Jilda Roche, DO  Erenumab-aooe (AIMOVIG) 140 MG/ML SOAJ Inject 140 mg into the skin every 28 (twenty-eight) days. 03/06/22   Everlena Cooper, Adam R, DO  glucose blood (ACCU-CHEK GUIDE) test strip 1 each by Other route 3 (three) times daily. Use as instructed 06/09/22   Shamleffer, Konrad Dolores, MD  hyoscyamine (LEVSIN SL) 0.125 MG SL tablet Place 1 tablet (0.125 mg total) under the tongue every 8 (eight) hours as needed.  08/31/22   Arnaldo Natal, NP  insulin aspart (NOVOLOG FLEXPEN) 100 UNIT/ML FlexPen Max daily 90 units 06/09/22   Shamleffer, Konrad Dolores, MD  insulin degludec (TRESIBA FLEXTOUCH) 100 UNIT/ML FlexTouch Pen Inject 24 Units into the skin daily. 06/09/22   Shamleffer, Konrad Dolores, MD  Insulin Pen Needle 32G X 4 MM MISC 1 Device by Does not apply route in the morning, at noon, in the evening, and at bedtime. 06/09/22   Shamleffer, Konrad Dolores, MD  metoCLOPramide (REGLAN) 10 MG tablet Take 1 tablet (10 mg total) by mouth every 6 (six) hours as needed for nausea. Patient not taking: Reported on 10/13/2022 09/18/22   Achille Rich, PA-C  naproxen (NAPROSYN) 500 MG tablet Take 1 tablet (500 mg total) by mouth 2 (two) times daily with a meal. 01/14/22   Wendling, Jilda Roche, DO  omeprazole (PRILOSEC) 20 MG capsule Take 1 capsule (20 mg total) by mouth daily. Take 30 minutes before breakfast. Patient not taking: Reported on 10/13/2022 08/31/22   Arnaldo Natal, NP  omeprazole (PRILOSEC) 20 MG capsule Take 1 capsule (20 mg total) by mouth daily. 10/13/22   Napoleon Form, MD  ondansetron (ZOFRAN) 4 MG tablet Take 1 tablet (4 mg total) by mouth every 8 (eight) hours as needed for nausea or vomiting. 05/27/22   Saguier, Ramon Dredge, PA-C  promethazine (PHENERGAN) 25 MG suppository Place 1 suppository (25 mg  total) rectally every 6 (six) hours as needed for nausea or vomiting. 09/23/22   Glyn Ade, MD  promethazine (PHENERGAN) 25 MG tablet Take 1 tablet (25 mg total) by mouth every 8 (eight) hours as needed for nausea or vomiting. 08/26/22   Sharlene Dory, DO  SUMAtriptan 6 MG/0.5ML SOAJ Inject 6 mg into the skin as needed. May repeat after 1 hour.  Maximum 2 injections in 24 hours. 03/06/22   Drema Dallas, DO      Allergies    Patient has no known allergies.    Review of Systems   Review of Systems  Constitutional:  Negative for chills and fever.  HENT:  Negative for  ear pain and sore throat.   Eyes:  Negative for pain and visual disturbance.  Respiratory:  Positive for shortness of breath. Negative for cough.   Cardiovascular:  Positive for chest pain. Negative for palpitations.  Gastrointestinal:  Positive for abdominal pain, nausea and vomiting.  Genitourinary:  Negative for dysuria and hematuria.  Musculoskeletal:  Negative for arthralgias and back pain.  Skin:  Negative for color change and rash.  Neurological:  Negative for seizures and syncope.  All other systems reviewed and are negative.   Physical Exam Updated Vital Signs BP (!) 121/97   Pulse 81   Temp 97.6 F (36.4 C) (Oral)   Resp 14   Ht 1.778 m (5\' 10" )   Wt 54.4 kg   LMP 01/02/2023 (Exact Date)   SpO2 96%   BMI 17.22 kg/m  Physical Exam Vitals and nursing note reviewed.  Constitutional:      General: She is not in acute distress.    Appearance: Normal appearance. She is well-developed.  HENT:     Head: Normocephalic and atraumatic.     Mouth/Throat:     Mouth: Mucous membranes are dry.  Eyes:     Extraocular Movements: Extraocular movements intact.     Conjunctiva/sclera: Conjunctivae normal.     Pupils: Pupils are equal, round, and reactive to light.  Cardiovascular:     Rate and Rhythm: Regular rhythm. Tachycardia present.     Heart sounds: No murmur heard. Pulmonary:     Effort: Pulmonary effort is normal. No respiratory distress.     Breath sounds: Normal breath sounds.  Abdominal:     General: There is no distension.     Palpations: Abdomen is soft.     Tenderness: There is no abdominal tenderness. There is no guarding.  Musculoskeletal:        General: No swelling.     Cervical back: Normal range of motion and neck supple.     Right lower leg: No edema.     Left lower leg: No edema.  Skin:    General: Skin is warm and dry.     Capillary Refill: Capillary refill takes less than 2 seconds.  Neurological:     General: No focal deficit present.      Mental Status: She is alert and oriented to person, place, and time.  Psychiatric:        Mood and Affect: Mood normal.     ED Results / Procedures / Treatments   Labs (all labs ordered are listed, but only abnormal results are displayed) Labs Reviewed  COMPREHENSIVE METABOLIC PANEL - Abnormal; Notable for the following components:      Result Value   Sodium 132 (*)    Potassium 3.3 (*)    Chloride 92 (*)    Glucose, Bld  367 (*)    All other components within normal limits  URINALYSIS, ROUTINE W REFLEX MICROSCOPIC - Abnormal; Notable for the following components:   Glucose, UA >=500 (*)    Hgb urine dipstick SMALL (*)    Bilirubin Urine SMALL (*)    Ketones, ur 15 (*)    Protein, ur 30 (*)    All other components within normal limits  CBC - Abnormal; Notable for the following components:   RBC 5.25 (*)    All other components within normal limits  URINALYSIS, MICROSCOPIC (REFLEX) - Abnormal; Notable for the following components:   Bacteria, UA FEW (*)    All other components within normal limits  RESP PANEL BY RT-PCR (RSV, FLU A&B, COVID)  RVPGX2  LIPASE, BLOOD  PREGNANCY, URINE  D-DIMER, QUANTITATIVE  TROPONIN I (HIGH SENSITIVITY)  TROPONIN I (HIGH SENSITIVITY)    EKG EKG Interpretation Date/Time:  Saturday January 09 2023 13:11:12 EST Ventricular Rate:  120 PR Interval:  139 QRS Duration:  92 QT Interval:  328 QTC Calculation: 464 R Axis:   82  Text Interpretation: Sinus tachycardia Consider right atrial enlargement Abnormal T, consider ischemia, diffuse leads Confirmed by Vanetta Mulders [phone]) on 01/09/2023 3:07:33 PM  Radiology DG Chest 2 View Result Date: 01/09/2023 CLINICAL DATA:  Chest pain, short of breath, abdominal pain for 4 days EXAM: CHEST - 2 VIEW COMPARISON:  04/18/2022 FINDINGS: Frontal and lateral views of the chest demonstrate an unremarkable cardiac silhouette. No acute airspace disease, effusion, or pneumothorax. No acute bony  abnormalities. IMPRESSION: 1. No acute intrathoracic process. Electronically Signed   By: Sharlet Salina M.D.   On: 01/09/2023 14:55    Procedures Procedures    Medications Ordered in ED Medications  sodium chloride 0.9 % bolus 1,000 mL (has no administration in time range)  0.9 %  sodium chloride infusion (has no administration in time range)  droperidol (INAPSINE) 2.5 MG/ML injection 2.5 mg (has no administration in time range)  diphenhydrAMINE (BENADRYL) injection 25 mg (has no administration in time range)  ondansetron (ZOFRAN) injection 4 mg (has no administration in time range)    ED Course/ Medical Decision Making/ A&P                                 Medical Decision Making Amount and/or Complexity of Data Reviewed Labs: ordered. Radiology: ordered.  Risk Prescription drug management.   Patient nontoxic no acute distress in appearance.  Respiratory panel negative lipase normal.  Complete metabolic panel sodium 132 potassium 3.3 chloride 92 glucose 367 CO2 reassuring at 27.  Renal function normal LFTs normal.  Anion gap normal.  Initial troponin was 3.  But feel that probably should have a delta troponin.  CBC white count 5 hemoglobin 14.8 hematocrit 42.3 platelets 264 urinalysis negative for urinary tract infection pregnancy test negative.  Chest x-ray no acute process.  I will give patient 1 L of normal saline and will hydrate also at 100 cc an hour.  Will get D-dimer second troponin.  Will probably plan on doing CT scan of abdomen and pelvis since the abdominal pain has been worse lately.  May also need CT angio chest we will see what we get on the D-dimer.  Will therefore hold off on ordering the CT scans.  Also treat with the Naprosyn Benadryl and Zofran.  More significantly does not seem to be in DKA.  Patient doing much better with the  medications and the IV fluids.  Patient's D-dimer was negative troponins x 2 without any acute abnormalities.  Patient CT scan abdomen  pelvis without any acute findings.  Clinically suspect this is probably gastroparesis.  Patient says she feels well enough for discharge home.   Final Clinical Impression(s) / ED Diagnoses Final diagnoses:  Generalized abdominal pain  Gastroparesis  Hyperglycemia    Rx / DC Orders ED Discharge Orders     None         Vanetta Mulders, MD 01/09/23 1556    Vanetta Mulders, MD 01/09/23 1557    Vanetta Mulders, MD 01/09/23 1927

## 2023-01-09 NOTE — ED Notes (Signed)
Patient transported to CT 

## 2023-01-09 NOTE — ED Notes (Signed)
Patient is being discharged from the Urgent Care and sent to the Emergency Department via private vehicle . Per Cheri Rous, PA patient is in need of higher level of care due to dehydration,abdominal pain, SOB, elevated BS . Patient is aware and verbalizes understanding of plan of care.  Vitals:   01/09/23 1152  BP: 112/69  Pulse: (!) 130  Resp: 16  Temp: 98 F (36.7 C)  SpO2: 98%

## 2023-01-09 NOTE — Discharge Instructions (Signed)
Follow-up with your endocrinologist.  Follow back up with your regular doctor.  Return for any new or worse symptoms.  Today's workup without any significant findings.  Follow your blood sugars carefully.

## 2023-01-09 NOTE — ED Triage Notes (Signed)
The patient is having chest pain, shortness of breath, abd pain for 4 days.

## 2023-01-09 NOTE — ED Provider Notes (Signed)
[facility]    CSN: [id number] Arrival date & time: 01/09/23  1038      History   Chief Complaint No chief complaint on file.   HPI Tina Fry is a 22 y.o. female the past medical history of type 1 diabetes uncontrolled, migraines, chronic abdominal pain with nausea and vomiting, hypertension who presents for evaluation of 5 days of nausea/vomiting, shortness of breath, abdominal pain, and migraine.  Patient states she is only been urinating once a day for the past several days as she cannot keep any food or fluids down.  She has not checked her blood sugar but does states she only has the symptoms when it gets very high.  Last A1c was 12.  She states she had a fever 3 days ago about 100 degrees that resolved and has not returned.  Denies any respiratory symptoms such as sore throat, cough or congestion.  States she feels short of breath with any type of activity or if she lays down.  No diarrhea.  She has been worked up by GI for gallbladder concerns.  She does not take anything for her migraines and has not taken any OTC medications for her current symptoms.  No other concerns at this time.  HPI  Past Medical History:  Diagnosis Date   Depression    Diabetes mellitus type I (HCC)    Follows with Endo   Hypertension     Patient Active Problem List   Diagnosis Date Noted   N&V (nausea and vomiting) 08/31/2022   Chronic bilateral lower abdominal pain 06/24/2022   Migraine with aura and without status migrainosus, not intractable 05/14/2020   Anxiety and depression 09/06/2017   Diabetes mellitus with hyperosmolarity without hyperglycemic hyperosmolar nonketotic coma (HCC) 08/17/2013   Dehydration 08/17/2013   Altered mental status 08/17/2013   Type 1 diabetes mellitus with hyperglycemia (HCC) 08/17/2013   Metabolic acidosis 08/17/2013   Renal insufficiency 08/17/2013   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      Home Medications    Prior to Admission medications   Medication Sig Start Date End Date Taking? Authorizing Provider  amitriptyline (ELAVIL) 10 MG tablet Take 1 tablet (10 mg total) by mouth at bedtime. 07/29/22   Wendling, Jilda Roche, DO  Erenumab-aooe (AIMOVIG) 140 MG/ML SOAJ Inject 140 mg into the skin every 28 (twenty-eight) days. 03/06/22   Everlena Cooper, Adam R, DO  glucose blood (ACCU-CHEK GUIDE) test strip 1 each by Other route 3 (three) times daily. Use as instructed 06/09/22   Shamleffer, Konrad Dolores, MD  hyoscyamine (LEVSIN SL) 0.125 MG SL tablet Place 1 tablet (0.125 mg total) under the tongue every 8 (eight) hours as needed. 08/31/22   Arnaldo Natal, NP  insulin aspart (NOVOLOG FLEXPEN) 100 UNIT/ML FlexPen Max daily 90 units 06/09/22   Shamleffer, Konrad Dolores, MD  insulin degludec (TRESIBA FLEXTOUCH) 100 UNIT/ML FlexTouch Pen Inject 24 Units into the skin daily. 06/09/22   Shamleffer, Konrad Dolores, MD  Insulin Pen Needle 32G X 4 MM MISC 1 Device by Does not apply route in the morning, at noon, in the evening, and at bedtime. 06/09/22   Shamleffer, Konrad Dolores, MD  metoCLOPramide (REGLAN) 10 MG tablet Take 1 tablet (10 mg total) by mouth every 6 (six) hours as needed for nausea. Patient not taking: Reported on 10/13/2022 09/18/22  Achille Rich, PA-C  naproxen (NAPROSYN) 500 MG tablet Take 1 tablet (500 mg total) by mouth 2 (two) times daily with a meal. 01/14/22   Wendling, Jilda Roche, DO  omeprazole (PRILOSEC) 20 MG capsule Take 1 capsule (20 mg total) by mouth daily. Take 30 minutes before breakfast. Patient not taking: Reported on 10/13/2022 08/31/22   Arnaldo Natal, NP  omeprazole (PRILOSEC) 20 MG capsule Take 1 capsule (20 mg total) by mouth daily. 10/13/22   Napoleon Form, MD  ondansetron (ZOFRAN) 4 MG tablet Take 1 tablet (4 mg total) by mouth every 8 (eight) hours as needed for  nausea or vomiting. 05/27/22   Saguier, Ramon Dredge, PA-C  promethazine (PHENERGAN) 25 MG suppository Place 1 suppository (25 mg total) rectally every 6 (six) hours as needed for nausea or vomiting. 09/23/22   Glyn Ade, MD  promethazine (PHENERGAN) 25 MG tablet Take 1 tablet (25 mg total) by mouth every 8 (eight) hours as needed for nausea or vomiting. 08/26/22   Sharlene Dory, DO  SUMAtriptan 6 MG/0.5ML SOAJ Inject 6 mg into the skin as needed. May repeat after 1 hour.  Maximum 2 injections in 24 hours. 03/06/22   Drema Dallas, DO    Family History Family History  Problem Relation Age of Onset   Anxiety disorder Mother    Depression Mother    Gestational diabetes Mother    Drug abuse Father    Lupus Maternal Grandmother    Hypertension Maternal Grandmother    Alcohol abuse Maternal Grandfather    Depression Maternal Grandfather    Drug abuse Maternal Grandfather    Mental illness Maternal Grandfather    Alcohol abuse Paternal Grandmother    Drug abuse Paternal Grandmother    Diabetes Mellitus II Paternal Grandfather    ADD / ADHD Paternal Grandfather    Diabetes Paternal Grandfather    Drug abuse Paternal Grandfather    Diabetes Mellitus I Maternal Aunt        Maternal great aunt   Diabetes Mellitus II Paternal Uncle    Stomach cancer Neg Hx    Colon cancer Neg Hx    Rectal cancer Neg Hx    Esophageal cancer Neg Hx     Social History Social History   Tobacco Use   Smoking status: Never    Passive exposure: Yes   Smokeless tobacco: Never  Vaping Use   Vaping status: Former  Substance Use Topics   Alcohol use: Not Currently    Comment: occ   Drug use: Yes    Types: Marijuana    Comment: once a month     Allergies   Patient has no known allergies.   Review of Systems Review of Systems  Constitutional:  Positive for fever.  Respiratory:  Positive for shortness of breath.   Gastrointestinal:  Positive for abdominal pain, nausea and vomiting.   Neurological:  Positive for headaches.     Physical Exam Triage Vital Signs ED Triage Vitals  Encounter Vitals Group     BP 01/09/23 1152 112/69     Systolic BP Percentile --      Diastolic BP Percentile --      Pulse Rate 01/09/23 1152 (!) 130     Resp 01/09/23 1152 16     Temp 01/09/23 1152 98 F (36.7 C)     Temp Source 01/09/23 1152 Oral     SpO2 01/09/23 1152 98 %     Weight --      Height --  Head Circumference --      Peak Flow --      Pain Score 01/09/23 1150 8     Pain Loc --      Pain Education --      Exclude from Growth Chart --    No data found.  Updated Vital Signs BP 112/69 (BP Location: Right Arm)   Pulse (!) 130   Temp 98 F (36.7 C) (Oral)   Resp 16   LMP 01/02/2023 (Exact Date)   SpO2 98%   Visual Acuity Right Eye Distance:   Left Eye Distance:   Bilateral Distance:    Right Eye Near:   Left Eye Near:    Bilateral Near:     Physical Exam Vitals and nursing note reviewed.  Constitutional:      Appearance: Normal appearance. She is ill-appearing. She is not diaphoretic.  HENT:     Head: Normocephalic and atraumatic.  Eyes:     Extraocular Movements: Extraocular movements intact.     Conjunctiva/sclera: Conjunctivae normal.     Pupils: Pupils are equal, round, and reactive to light.  Cardiovascular:     Rate and Rhythm: Regular rhythm. Tachycardia present.     Heart sounds: Normal heart sounds.  Pulmonary:     Effort: Pulmonary effort is normal. No respiratory distress.     Breath sounds: Normal breath sounds. No wheezing.  Abdominal:     Palpations: Abdomen is soft.     Tenderness: There is generalized abdominal tenderness.     Comments: Worse in right upper quadrant  Neurological:     Mental Status: She is alert.      UC Treatments / Results  Labs (all labs ordered are listed, but only abnormal results are displayed) Labs Reviewed  POCT FASTING CBG [facility] MANUAL ENTRY - Abnormal; Notable for the following components:       Result Value   POCT Glucose ([facility]) 345 (*)    All other components within normal limits    EKG   Radiology No results found.  Procedures Procedures (including critical care time)  Medications Ordered in UC Medications - No data to display  Initial Impression / Assessment and Plan / UC Course  I have reviewed the triage vital signs and the nursing notes.  Pertinent labs & imaging results that were available during my care of the patient were reviewed by me and considered in my medical decision making (see chart for details).     Reviewed exam and symptoms with patient.  Discussed limitations and abilities of urgent care.  Patient with complex medical history presenting with 5 days of nausea/vomiting, abdominal pain, fevers, shortness of breath, and decreased urine output.  Blood sugar 341 in clinic.  Patient is mildly tachycardic at 130.  Given her history and symptoms I advise she go to the emergency room for further workup.  As she has her son with her she declined EMS and states her mother will drive her.  She was instructed to pull over and call 911 for any worsening symptoms that occur in transit and she verbalized understanding.  Final Clinical Impressions(s) / UC Diagnoses   Final diagnoses:  Nausea and vomiting, unspecified vomiting type  Acute intractable headache, unspecified headache type  Shortness of breath  Decreased urine output  Type 1 diabetes mellitus with other specified complication [facility])     Discharge Instructions      Please go to the emergency room for further workup of your symptoms  ED Prescriptions   None    PDMP not reviewed this encounter.   Radford Pax, NP 01/09/23 [phone]

## 2023-01-09 NOTE — Discharge Instructions (Addendum)
 Please go to the emergency room for further workup of your symptoms.

## 2023-01-09 NOTE — ED Notes (Signed)
PT is currently not awake enough to get temp, will try back later, but on monitor

## 2023-01-09 NOTE — ED Triage Notes (Signed)
Pt presents to UC for c/o nausea, vomting, shortness of breath, chest pain, abd pain, migraine x5 days. Pt reports a nose bleed yesterday.

## 2023-01-14 ENCOUNTER — Other Ambulatory Visit: Payer: Self-pay | Admitting: Nurse Practitioner

## 2023-02-10 ENCOUNTER — Encounter (INDEPENDENT_AMBULATORY_CARE_PROVIDER_SITE_OTHER): Payer: Managed Care, Other (non HMO) | Admitting: Family Medicine

## 2023-02-10 DIAGNOSIS — N898 Other specified noninflammatory disorders of vagina: Secondary | ICD-10-CM | POA: Diagnosis not present

## 2023-02-10 MED ORDER — METRONIDAZOLE 500 MG PO TABS: 500.0000 mg | ORAL_TABLET | Freq: Two times a day (BID) | ORAL | 0 refills | Status: AC

## 2023-02-10 NOTE — Telephone Encounter (Signed)

## 2023-03-03 ENCOUNTER — Encounter: Payer: Self-pay | Admitting: Family Medicine

## 2023-03-08 ENCOUNTER — Ambulatory Visit: Payer: Managed Care, Other (non HMO) | Admitting: Family Medicine

## 2023-03-08 VITALS — BP 110/74 | Temp 98.0°F | Resp 16 | Ht 70.0 in | Wt 132.0 lb

## 2023-03-08 DIAGNOSIS — N898 Other specified noninflammatory disorders of vagina: Secondary | ICD-10-CM | POA: Diagnosis not present

## 2023-03-08 DIAGNOSIS — R109 Unspecified abdominal pain: Secondary | ICD-10-CM | POA: Diagnosis not present

## 2023-03-08 MED ORDER — ONDANSETRON 4 MG PO TBDP: 4.0000 mg | ORAL_TABLET | Freq: Three times a day (TID) | ORAL | 0 refills | Status: AC | PRN

## 2023-03-08 MED ORDER — FLUCONAZOLE 150 MG PO TABS: ORAL_TABLET | ORAL | 0 refills | Status: DC

## 2023-03-08 NOTE — Patient Instructions (Signed)
 Please reach out to Dr. Lonzo Cloud.   Let us know if you need anything.

## 2023-03-08 NOTE — Progress Notes (Signed)
 Chief Complaint  Patient presents with   Abdominal Pain    Discuss Stomach     Subjective: Patient is a 23 y.o. female here for f/u.  Chronic abd pain. Referred to gen surg. Plan was to get in with a RUQ [place] and a HIDA if neg. Never was called. Having intermittent nausea. No fevers. Elavil was not helpful. Saw GYN. Considering PCOS vs endometriosis, wanted to r/o GB involvement first.   Over the last week, the patient has had recurrent discharge.  There is no pain, bleeding, or new sexual partners.  She is not having any fevers.  No abdominal pain that is new.  Past Medical History:  Diagnosis Date   Depression    Diabetes mellitus type I (HCC)    Follows with Endo   Hypertension     Objective: BP 110/74 (BP Location: Left Arm, Patient Position: Sitting)   Temp 98 F (36.7 C) (Oral)   Resp 16   Ht 5\' 10"  (1.778 m)   Wt 132 lb (59.9 kg)   SpO2 98%   BMI 18.94 kg/m  General: Awake, appears stated age Heart: RRR, no LE edema Lungs: CTAB, no rales, wheezes or rhonchi. No accessory muscle use Abd: BS+, S, mild ttp in lower quadrants and RUQ; neg Murphy's, McBurney's, Carnett's, Rovsing's Psych: Age appropriate judgment and insight, normal affect and mood  Assessment and Plan: Abdominal pain, unspecified abdominal location - Plan: US Abdomen Limited RUQ (LIVER/GB), ondansetron (ZOFRAN-ODT) 4 MG disintegrating tablet  Vaginal discharge - Plan: fluconazole (DIFLUCAN) 150 MG tablet  Chronic, not controlled. Cont Zofran 4 mg prn. Ck [place]. Will need to get in touch with gen surg again, would prefer they order a HIDA if needed. Reach out to Endo given her poorly controlled DM.  Diflucan.  The patient voiced understanding and agreement to the plan.  Jilda Roche Plummer, DO 03/08/23  12:15 PM

## 2023-03-18 ENCOUNTER — Ambulatory Visit (HOSPITAL_BASED_OUTPATIENT_CLINIC_OR_DEPARTMENT_OTHER)
Admission: RE | Admit: 2023-03-18 | Discharge: 2023-03-18 | Disposition: A | Discharge status: Home / Self Care | Payer: Managed Care, Other (non HMO) | Source: Ambulatory Visit | Attending: Family Medicine | Admitting: Family Medicine

## 2023-03-18 DIAGNOSIS — R1011 Right upper quadrant pain: Secondary | ICD-10-CM | POA: Insufficient documentation

## 2023-03-18 DIAGNOSIS — R109 Unspecified abdominal pain: Secondary | ICD-10-CM | POA: Insufficient documentation

## 2023-03-22 ENCOUNTER — Encounter: Payer: Self-pay | Admitting: Family Medicine

## 2023-03-30 ENCOUNTER — Emergency Department (HOSPITAL_BASED_OUTPATIENT_CLINIC_OR_DEPARTMENT_OTHER)
Admission: EM | Admit: 2023-03-30 | Discharge: 2023-03-30 | Disposition: A | Discharge status: Home / Self Care | Attending: Emergency Medicine | Admitting: Emergency Medicine

## 2023-03-30 ENCOUNTER — Other Ambulatory Visit: Payer: Self-pay

## 2023-03-30 ENCOUNTER — Encounter (HOSPITAL_BASED_OUTPATIENT_CLINIC_OR_DEPARTMENT_OTHER): Payer: Self-pay

## 2023-03-30 DIAGNOSIS — E109 Type 1 diabetes mellitus without complications: Secondary | ICD-10-CM | POA: Insufficient documentation

## 2023-03-30 DIAGNOSIS — M7989 Other specified soft tissue disorders: Secondary | ICD-10-CM | POA: Diagnosis present

## 2023-03-30 DIAGNOSIS — R600 Localized edema: Secondary | ICD-10-CM | POA: Diagnosis not present

## 2023-03-30 LAB — CBC WITH DIFFERENTIAL/PLATELET
Abs Immature Granulocytes: 0.02 10*3/uL (ref 0.00–0.07)
Basophils Absolute: 0.1 10*3/uL (ref 0.0–0.1)
Basophils Relative: 1 %
Eosinophils Absolute: 0.1 10*3/uL (ref 0.0–0.5)
Eosinophils Relative: 1 %
HCT: 33.2 % — ABNORMAL LOW (ref 36.0–46.0)
Hemoglobin: 10.9 g/dL — ABNORMAL LOW (ref 12.0–15.0)
Immature Granulocytes: 0 %
Lymphocytes Relative: 27 %
Lymphs Abs: 1.7 10*3/uL (ref 0.7–4.0)
MCH: 27.9 pg (ref 26.0–34.0)
MCHC: 32.8 g/dL (ref 30.0–36.0)
MCV: 84.9 fL (ref 80.0–100.0)
Monocytes Absolute: 0.4 10*3/uL (ref 0.1–1.0)
Monocytes Relative: 6 %
Neutro Abs: 4.2 10*3/uL (ref 1.7–7.7)
Neutrophils Relative %: 65 %
Platelets: 187 10*3/uL (ref 150–400)
RBC: 3.91 MIL/uL (ref 3.87–5.11)
RDW: 13.1 % (ref 11.5–15.5)
WBC: 6.4 10*3/uL (ref 4.0–10.5)
nRBC: 0 % (ref 0.0–0.2)

## 2023-03-30 LAB — CBG MONITORING, ED
Glucose-Capillary: 396 mg/dL — ABNORMAL HIGH (ref 70–99)
Glucose-Capillary: 314 mg/dL — ABNORMAL HIGH (ref 70–99)

## 2023-03-30 LAB — COMPREHENSIVE METABOLIC PANEL
ALT: 42 U/L (ref 0–44)
AST: 36 U/L (ref 15–41)
Albumin: 3.8 g/dL (ref 3.5–5.0)
Alkaline Phosphatase: 47 U/L (ref 38–126)
Anion gap: 9 (ref 5–15)
BUN: 12 mg/dL (ref 6–20)
CO2: 25 mmol/L (ref 22–32)
Calcium: 9.2 mg/dL (ref 8.9–10.3)
Chloride: 102 mmol/L (ref 98–111)
Creatinine, Ser: 0.64 mg/dL (ref 0.44–1.00)
GFR, Estimated: >60 mL/min (ref >60)
Glucose, Bld: 431 mg/dL — ABNORMAL HIGH (ref 70–99)
Potassium: 4.2 mmol/L (ref 3.5–5.1)
Sodium: 136 mmol/L (ref 135–145)
Total Bilirubin: 0.4 mg/dL (ref 0.0–1.2)
Total Protein: 6.7 g/dL (ref 6.5–8.1)

## 2023-03-30 MED ORDER — INSULIN ASPART 100 UNIT/ML IJ SOLN
8.0000 [IU] | Freq: Once | INTRAMUSCULAR | Status: AC
  Administered 2023-03-30: 8 [IU] via SUBCUTANEOUS

## 2023-03-30 MED ORDER — SODIUM CHLORIDE 0.9 % IV BOLUS
500.0000 mL | Freq: Once | INTRAVENOUS | Status: AC
  Administered 2023-03-30: 500 mL via INTRAVENOUS

## 2023-03-30 NOTE — ED Provider Notes (Signed)
 [place] [facility] Provider Note   CSN: [id number] Arrival date & time: 03/30/23  9528     History  Chief Complaint  Patient presents with   Leg Swelling    DELAYNIE Fry is a 23 y.o. female.  Patient here with some intermittent leg swelling.  History of type 1 diabetes.  She does work in a [employer].  States swelling is improved since overnight.  She just got out of work.  She denies any pain chest pain shortness of breath weakness numbness tingling.  Denies any new medications.  Denies any kidney or liver issues.  She did have swelling her legs during her pregnancy in the past.  Denies any fever or chills.  The history is provided by the patient.       Home Medications Prior to Admission medications   Medication Sig Start Date End Date Taking? Authorizing Provider  Erenumab-aooe (AIMOVIG) 140 MG/ML SOAJ Inject 140 mg into the skin every 28 (twenty-eight) days. 03/06/22   Tina Dallas, DO  fluconazole (DIFLUCAN) 150 MG tablet Take 1 tab, repeat in 72 hours if no improvement. 03/08/23   Tina Dory, DO  glucose blood (ACCU-CHEK GUIDE) test strip 1 each by Other route 3 (three) times daily. Use as instructed 06/09/22   Shamleffer, Tina Dolores, MD  hyoscyamine (LEVSIN SL) 0.125 MG SL tablet DISSOLVE 1 TABLET(0.125 MG) UNDER THE TONGUE EVERY 8 HOURS AS NEEDED 01/15/23   Tina Natal, NP  insulin aspart (NOVOLOG FLEXPEN) 100 UNIT/ML FlexPen Max daily 90 units 06/09/22   Shamleffer, Tina Dolores, MD  insulin degludec (TRESIBA FLEXTOUCH) 100 UNIT/ML FlexTouch Pen Inject 24 Units into the skin daily. 06/09/22   Shamleffer, Tina Dolores, MD  Insulin Pen Needle 32G X 4 MM MISC 1 Device by Does not apply route in the morning, at noon, in the evening, and at bedtime. 06/09/22   Shamleffer, Tina Dolores, MD  metoCLOPramide (REGLAN) 10 MG tablet Take 1 tablet (10 mg total) by mouth every 6 (six) hours as needed for  nausea. Patient not taking: Reported on 10/13/2022 09/18/22   Tina Rich, PA-C  naproxen (NAPROSYN) 500 MG tablet Take 1 tablet (500 mg total) by mouth 2 (two) times daily with a meal. 01/14/22   Tina, Jilda Roche, DO  omeprazole (PRILOSEC) 20 MG capsule Take 1 capsule (20 mg total) by mouth daily. Take 30 minutes before breakfast. Patient not taking: Reported on 10/13/2022 08/31/22   Tina Natal, NP  omeprazole (PRILOSEC) 20 MG capsule Take 1 capsule (20 mg total) by mouth daily. 10/13/22   Tina Form, MD  ondansetron (ZOFRAN-ODT) 4 MG disintegrating tablet Take 1 tablet (4 mg total) by mouth every 8 (eight) hours as needed for nausea or vomiting. 03/08/23   Tina Fry, Jilda Roche, DO  promethazine (PHENERGAN) 25 MG suppository Place 1 suppository (25 mg total) rectally every 6 (six) hours as needed for nausea or vomiting. 09/23/22   Tina Ade, MD  promethazine (PHENERGAN) 25 MG tablet Take 1 tablet (25 mg total) by mouth every 8 (eight) hours as needed for nausea or vomiting. 08/26/22   Tina Dory, DO  SUMAtriptan 6 MG/0.5ML SOAJ Inject 6 mg into the skin as needed. May repeat after 1 hour.  Maximum 2 injections in 24 hours. 03/06/22   Tina Dallas, DO      Allergies    Patient has no known allergies.    Review of Systems   Review of Systems  Physical Exam Updated Vital Signs BP 114/83 (BP Location: Right Arm)   Pulse 92   Temp 97.9 F (36.6 C) (Oral)   Resp 18   Ht 5\' 10"  (1.778 m)   Wt 63.5 kg   SpO2 100%   BMI 20.09 kg/m  Physical Exam Vitals and nursing note reviewed.  Constitutional:      General: She is not in acute distress.    Appearance: She is well-developed. She is not ill-appearing.  HENT:     Head: Normocephalic and atraumatic.     Nose: Nose normal.     Mouth/Throat:     Mouth: Mucous membranes are moist.  Eyes:     Extraocular Movements: Extraocular movements intact.     Conjunctiva/sclera: Conjunctivae normal.      Pupils: Pupils are equal, round, and reactive to light.  Cardiovascular:     Rate and Rhythm: Normal rate and regular rhythm.     Pulses: Normal pulses.     Heart sounds: Normal heart sounds. No murmur heard. Pulmonary:     Effort: Pulmonary effort is normal. No respiratory distress.     Breath sounds: Normal breath sounds.  Abdominal:     Palpations: Abdomen is soft.     Tenderness: There is no abdominal tenderness.  Musculoskeletal:        General: No swelling.     Cervical back: Normal range of motion and neck supple.     Comments: Trace edema in both lower legs mostly in the mid shin to ankle  Skin:    General: Skin is warm and dry.     Capillary Refill: Capillary refill takes less than 2 seconds.  Neurological:     General: No focal deficit present.     Mental Status: She is alert and oriented to person, place, and time.  Psychiatric:        Mood and Affect: Mood normal.     ED Results / Procedures / Treatments   Labs (all labs ordered are listed, but only abnormal results are displayed) Labs Reviewed  CBC WITH DIFFERENTIAL/PLATELET - Abnormal; Notable for the following components:      Result Value   Hemoglobin 10.9 (*)    HCT 33.2 (*)    All other components within normal limits  COMPREHENSIVE METABOLIC PANEL - Abnormal; Notable for the following components:   Glucose, Bld 431 (*)    All other components within normal limits  CBG MONITORING, ED - Abnormal; Notable for the following components:   Glucose-Capillary 396 (*)    All other components within normal limits  CBG MONITORING, ED - Abnormal; Notable for the following components:   Glucose-Capillary 314 (*)    All other components within normal limits    EKG None  Radiology No results found.  Procedures Procedures    Medications Ordered in ED Medications  sodium chloride 0.9 % bolus 500 mL (0 mLs Intravenous Stopped 03/30/23 0839)  insulin aspart (novoLOG) injection 8 Units (8 Units Subcutaneous  Given 03/30/23 8469)    ED Course/ Medical Decision Making/ A&P                                 Medical Decision Making Amount and/or Complexity of Data Reviewed Labs: ordered.  Risk Prescription drug management.   Tina Fry is here with some lower leg swelling.  This has been intermittent in the last few days.  Seems to occur when  she is at work standing but not necessarily sure if that is fully related.  She is a type I diabetic.  Vitals are normal.  Swelling is fairly minimal on exam.  Differential is that this is likely venous stasis but will evaluate for any liver or kidney dysfunction.  Have no concern for heart failure.  Her blood pressure is excellent.  She has no shortness of breath and clear breath sounds.  Will check CBC CMP blood sugar.  I suspect that intermittent swelling likely due to standing on cement floor at work/may be some electrolyte shifts given her diabetes.  Lab work per my review and interpretation is unremarkable blood sugar however elevated in the 400s but she is not in DKA.  Liver enzymes kidney function unremarkable.  She is given fluids NovoLog and had improvement of her blood sugar.  I suspect peripheral edema intermittently likely from standing on cement floor at her job and likely from may be some fluid shifts with her glucose and sodium.  Encouraged her to keep tighter control of her blood sugars follow-up with primary care recommend compression socks making sure she is elevating her legs at nighttime.  Discharged in good condition.  Understands return precautions.  This chart was dictated using voice recognition software.  Despite best efforts to proofread,  errors can occur which can change the documentation meaning.         Final Clinical Impression(s) / ED Diagnoses Final diagnoses:  Peripheral edema    Rx / DC Orders ED Discharge Orders     None         Virgina Norfolk, DO 03/30/23 1610

## 2023-03-30 NOTE — ED Triage Notes (Signed)
 States the last couple days her legs and feet have been swelling up to knees.   Hx of same when pregnant and had hypertension.

## 2023-03-30 NOTE — ED Notes (Signed)
 Pt states that she ate prior to arrival in the ED.

## 2023-04-29 ENCOUNTER — Telehealth: Admitting: Family Medicine

## 2023-04-29 ENCOUNTER — Encounter: Payer: Self-pay | Admitting: Family Medicine

## 2023-04-29 DIAGNOSIS — R1032 Left lower quadrant pain: Secondary | ICD-10-CM

## 2023-04-29 DIAGNOSIS — G8929 Other chronic pain: Secondary | ICD-10-CM | POA: Diagnosis not present

## 2023-04-29 DIAGNOSIS — R1031 Right lower quadrant pain: Secondary | ICD-10-CM

## 2023-04-29 NOTE — Progress Notes (Signed)
 Chief Complaint  Patient presents with   Acute Visit    Patient presents today for abdominal pain.    Subjective: Patient is a 23 y.o. female here for f/u. We are interacting via web portal for an electronic face-to-face visit. I verified patient's ID using 2 identifiers. Patient agreed to proceed with visit via this method. Patient is at home, I am at office. Patient and I are present for visit.   Patient has been dealing with chronic lower abdominal pain/cramping for at least several months.  Lately it has been more cyclical.  She is missing work for several days at a time during her cycle.  She saw her gynecology team 2 days ago.  She was told several visits ago that this could be endometriosis.  She has never had a laparoscopic procedure to evaluate this further.  She has had poor adverse effects with the Depo injection and does not want to be on anything like that.  Her gynecology team sent in norethindrone 0.35 mg daily which she has not picked up yet.  She was unaware it was sent.  No new symptoms.  Past Medical History:  Diagnosis Date   Depression    Diabetes mellitus type I (HCC)    Follows with Endo   Hypertension     Objective: No conversational dyspnea Age appropriate judgment and insight Nml affect and mood  Assessment and Plan: Chronic bilateral lower abdominal pain  Chronic, not currently controlled.  Appreciate gynecology input.  I do not think the Micronor is a bad idea.  I would like her to schedule follow-up with the regular GYN in the next 4 to 6 weeks to see how things are going.  He did discuss endometriosis per the patient so it would be best to see what he would like to do moving forward.  The gynecologist she saw did not seem to be interested in pursuing that diagnosis with her as she did not have an intimate knowledge of the patient's history the same way her practicing partner does. The patient voiced understanding and agreement to the plan.  Shellie Dials  [place], DO 04/29/23  10:16 AM

## 2023-05-03 LAB — LAB REPORT - SCANNED

## 2023-05-18 ENCOUNTER — Encounter: Payer: Self-pay | Admitting: Family Medicine

## 2023-06-02 ENCOUNTER — Encounter: Payer: Self-pay | Admitting: Family Medicine

## 2023-06-28 ENCOUNTER — Encounter: Payer: Self-pay | Admitting: Family Medicine

## 2023-07-24 NOTE — ED Provider Notes (Signed)
 [facility] Emergency Department Physician Note  ED Course - HPI and Medical Decision Making  Chief Complaint: Abdominal Pain, Vomiting, and Hyperglycemia   History is obtained from Patient and EMS/Paramedics HPI: This is a 23 y.o. female who presents to the emergency department with diffuse abdominal pain, onset this afternoon. Hx of cannabinoid hyperemesis and DM. Was unable to take her diabetic medications d/t vomiting. Reports she was smoking marijuana recently. Denies fever. No diarrhea. Pain is diffuse. No vaginal bleeding/discharge. Denies pregnancy. No urinary symptoms..   MDM and Assessment: -Differential diagnosis includes, but not limited to: DDX: SBO, appendicitis, cholecystitis, gastritis, PUD, kidney stone, hernia, ETOH, drug abuse, ingestion, vertigo, increased intracranial pressure, ICH, tumor, concussion, infection, dehydration, DKA, hyponatremia   -Initial plan: Place patient on telemetry with continuous pulse oximeter reading and vital signs per emergency protocol, CBC to evaluate cell lines for anemia, thrombocytopenia, leukocytosis, leukopenia, CMP to evaluate electrolyte derangements, renal function, liver function, Lipase to evaluate for pancreatic injury, insufficiency, acute pancreatitis, and HCG  -Cardiac telemetry ordered for continuous cardiac and hemodynamic monitoring; reviewed telemetry showing Normal sinus rhythm without arrhythmia  -Patient presents with diffuse abdominal pain. Unable to reproduce or note a focal area of tenderness. She is theatrical and in acute distress on arrival, writhing in pain. She has not had any bloody bowel movements or any known hx of afib/vascular abnormalities that would make me think this is ischemic related. Her CBC is unremarkable. Her CMP shows hyperglycemia. She was given insulin  for this. Her lipase is normal. HCG negative. Given fluids, droperidol , and morphine  for pain. Repeat glucose improved to  297. Her symptoms resolved after droperidol . I favor this is cannabinoid hyperemesis.   No evidence of DKA based on laboratory assessment.  Once she was improved and able to discuss more, seems symptoms are c/w previous episodes when she had cannabinoid hyperemesis. She has no focal tenderness or pain on repeat evaluation; do not feel imaging is indicated.  On reevaluation, patient appears well. Afebrile, hemodynamically stable, sPO2 stable, and is stable for discharge home. Follow-up was recommended and return precautions discussed.   Medical Decision Making Problems Addressed: Cannabinoid hyperemesis syndrome: complicated acute illness or injury Diffuse abdominal pain: complicated acute illness or injury  Amount and/or Complexity of Data Reviewed Labs: ordered.  Risk Prescription drug management.    Factors Impacting ED Encounter Complexity: Decision regarding hospitalization or escalation of hospital level care and Discussion regarding prescription drug management  Impression  Patient's presentation is most consistent with acute presentation with potential threat to life or bodily function. 1. Diffuse abdominal pain   2. Cannabinoid hyperemesis syndrome    ED Disposition     ED Disposition  Discharge   Condition  Stable   Comment  --        Follow-up [facility] [address] [place] [address] [phone]     Review of Systems  A pertinent review of systems was obtained and was negative except as noted in the HPI and MDM. Physical Exam   ED Triage Vitals [07/23/23 2233]  Temp 98.2 F (36.8 C)  Heart Rate 104  Resp 18  BP 119/81  MAP (mmHg) 93  SpO2 96 %  O2 Device None (Room air)  O2 Flow Rate (L/min)   Weight 54.4 kg (120 lb)   Physical Exam  Constitutional Nursing notes reviewed Vital signs reviewed Acute distress, writhing around bed in pain  HEENT No obvious trauma Supple without meningismus  Pupils cross  midline No scleral icterus or injection  Respiratory Effort normal CTAB No respiratory distress  CV Normal rate No pitting edema 2+ radial and DP pulses  Abdomen Soft Diffuse pain reported, no focal tenderness Non-distended No peritonitis  MSK Atraumatic No obvious deformity ROM appropriate  Skin Warm Dry  Neuro Awake and alert Pupils cross midline Moving all extremities  Psychiatric Anxious     Other pertinent exam findings per MDM-ED Course Procedures  Procedures

## 2023-07-29 ENCOUNTER — Encounter (HOSPITAL_BASED_OUTPATIENT_CLINIC_OR_DEPARTMENT_OTHER): Payer: Self-pay | Admitting: Emergency Medicine

## 2023-07-29 ENCOUNTER — Emergency Department (HOSPITAL_BASED_OUTPATIENT_CLINIC_OR_DEPARTMENT_OTHER)
Admission: EM | Admit: 2023-07-29 | Discharge: 2023-07-29 | Disposition: A | Discharge status: Home / Self Care | Attending: Emergency Medicine | Admitting: Emergency Medicine

## 2023-07-29 ENCOUNTER — Other Ambulatory Visit: Payer: Self-pay

## 2023-07-29 DIAGNOSIS — E109 Type 1 diabetes mellitus without complications: Secondary | ICD-10-CM | POA: Insufficient documentation

## 2023-07-29 DIAGNOSIS — Z794 Long term (current) use of insulin: Secondary | ICD-10-CM | POA: Diagnosis not present

## 2023-07-29 DIAGNOSIS — R112 Nausea with vomiting, unspecified: Secondary | ICD-10-CM | POA: Diagnosis present

## 2023-07-29 DIAGNOSIS — R109 Unspecified abdominal pain: Secondary | ICD-10-CM

## 2023-07-29 DIAGNOSIS — I10 Essential (primary) hypertension: Secondary | ICD-10-CM | POA: Diagnosis not present

## 2023-07-29 DIAGNOSIS — Z79899 Other long term (current) drug therapy: Secondary | ICD-10-CM | POA: Insufficient documentation

## 2023-07-29 DIAGNOSIS — E860 Dehydration: Secondary | ICD-10-CM | POA: Diagnosis not present

## 2023-07-29 LAB — COMPREHENSIVE METABOLIC PANEL WITH GFR
ALT: 11 U/L (ref 0–44)
AST: 15 U/L (ref 15–41)
Albumin: 4.6 g/dL (ref 3.5–5.0)
Alkaline Phosphatase: 74 U/L (ref 38–126)
Anion gap: 20 — ABNORMAL HIGH (ref 5–15)
BUN: 12 mg/dL (ref 6–20)
CO2: 25 mmol/L (ref 22–32)
Calcium: 10 mg/dL (ref 8.9–10.3)
Chloride: 94 mmol/L — ABNORMAL LOW (ref 98–111)
Creatinine, Ser: 0.75 mg/dL (ref 0.44–1.00)
GFR, Estimated: >60 mL/min (ref >60)
Glucose, Bld: 347 mg/dL — ABNORMAL HIGH (ref 70–99)
Potassium: 4 mmol/L (ref 3.5–5.1)
Sodium: 139 mmol/L (ref 135–145)
Total Bilirubin: 0.7 mg/dL (ref 0.0–1.2)
Total Protein: 7.6 g/dL (ref 6.5–8.1)

## 2023-07-29 LAB — URINALYSIS, ROUTINE W REFLEX MICROSCOPIC
Glucose, UA: >=500 mg/dL — AB
Ketones, ur: >=80 mg/dL — AB
Leukocytes,Ua: NEGATIVE
Nitrite: NEGATIVE
Protein, ur: 30 mg/dL — AB
Specific Gravity, Urine: 1.025 (ref 1.005–1.030)
pH: 5.5 (ref 5.0–8.0)

## 2023-07-29 LAB — CBC
HCT: 41.9 % (ref 36.0–46.0)
Hemoglobin: 14.4 g/dL (ref 12.0–15.0)
MCH: 27.7 pg (ref 26.0–34.0)
MCHC: 34.4 g/dL (ref 30.0–36.0)
MCV: 80.6 fL (ref 80.0–100.0)
Platelets: 253 K/uL (ref 150–400)
RBC: 5.2 MIL/uL — ABNORMAL HIGH (ref 3.87–5.11)
RDW: 12 % (ref 11.5–15.5)
WBC: 5 K/uL (ref 4.0–10.5)
nRBC: 0 % (ref 0.0–0.2)

## 2023-07-29 LAB — I-STAT VENOUS BLOOD GAS, ED
Acid-Base Excess: 4 mmol/L — ABNORMAL HIGH (ref 0.0–2.0)
Bicarbonate: 29.2 mmol/L — ABNORMAL HIGH (ref 20.0–28.0)
Calcium, Ion: 1.16 mmol/L (ref 1.15–1.40)
HCT: 46 % (ref 36.0–46.0)
Hemoglobin: 15.6 g/dL — ABNORMAL HIGH (ref 12.0–15.0)
O2 Saturation: 45 %
Patient temperature: 98.3
Potassium: 3.6 mmol/L (ref 3.5–5.1)
Sodium: 137 mmol/L (ref 135–145)
TCO2: 31 mmol/L (ref 22–32)
pCO2, Ven: 43.3 mmHg — ABNORMAL LOW (ref 44–60)
pH, Ven: 7.436 — ABNORMAL HIGH (ref 7.25–7.43)
pO2, Ven: 24 mmHg — CL (ref 32–45)

## 2023-07-29 LAB — URINALYSIS, MICROSCOPIC (REFLEX)

## 2023-07-29 LAB — BASIC METABOLIC PANEL WITH GFR
Anion gap: 14 (ref 5–15)
BUN: 11 mg/dL (ref 6–20)
CO2: 27 mmol/L (ref 22–32)
Calcium: 9.6 mg/dL (ref 8.9–10.3)
Chloride: 97 mmol/L — ABNORMAL LOW (ref 98–111)
Creatinine, Ser: 0.65 mg/dL (ref 0.44–1.00)
GFR, Estimated: >60 mL/min (ref >60)
Glucose, Bld: 294 mg/dL — ABNORMAL HIGH (ref 70–99)
Potassium: 3.9 mmol/L (ref 3.5–5.1)
Sodium: 138 mmol/L (ref 135–145)

## 2023-07-29 LAB — PREGNANCY, URINE: Preg Test, Ur: NEGATIVE

## 2023-07-29 LAB — LIPASE, BLOOD: Lipase: 15 U/L (ref 11–51)

## 2023-07-29 LAB — CBG MONITORING, ED: Glucose-Capillary: 288 mg/dL — ABNORMAL HIGH (ref 70–99)

## 2023-07-29 MED ORDER — LACTATED RINGERS IV SOLN: INTRAVENOUS | Status: DC

## 2023-07-29 MED ORDER — LACTATED RINGERS IV BOLUS
1000.0000 mL | Freq: Once | INTRAVENOUS | Status: AC
  Administered 2023-07-29: 1000 mL via INTRAVENOUS

## 2023-07-29 MED ORDER — METOCLOPRAMIDE HCL 10 MG PO TABS
10.0000 mg | ORAL_TABLET | Freq: Four times a day (QID) | ORAL | 0 refills | Status: DC | PRN
Start: 2023-07-29 — End: 2023-10-04

## 2023-07-29 MED ORDER — PROMETHAZINE HCL 25 MG RE SUPP: 25.0000 mg | Freq: Four times a day (QID) | RECTAL | 1 refills | Status: DC | PRN

## 2023-07-29 MED ORDER — MORPHINE SULFATE (PF) 4 MG/ML IV SOLN
4.0000 mg | Freq: Once | INTRAVENOUS | Status: AC
  Administered 2023-07-29: 4 mg via INTRAVENOUS
  Filled 2023-07-29: qty 1

## 2023-07-29 MED ORDER — HYDROCODONE-ACETAMINOPHEN 5-325 MG PO TABS: 1.0000 | ORAL_TABLET | Freq: Four times a day (QID) | ORAL | 0 refills | Status: DC | PRN

## 2023-07-29 MED ORDER — DROPERIDOL 2.5 MG/ML IJ SOLN
2.5000 mg | Freq: Once | INTRAMUSCULAR | Status: AC
  Administered 2023-07-29: 2.5 mg via INTRAVENOUS
  Filled 2023-07-29: qty 2

## 2023-07-29 NOTE — ED Triage Notes (Signed)
 Pt POv c/o abd pain, n/v x 1 week. Seen last week for same.   Hydrocodone  taken appx 0830, no relief.   Recent dx of endometriosis.

## 2023-07-29 NOTE — Discharge Instructions (Signed)
 You were dehydrated today but that improved with the fluids.  Make sure you continue to use your insulin  and check your blood sugars.  Also the new prescriptions were sent to your pharmacy.

## 2023-07-29 NOTE — ED Provider Notes (Signed)
 [place] [facility] AT [facility] Provider Note   CSN: [id number] Arrival date & time: 07/29/23  1540     Patient presents with: Abdominal Pain   Tina Fry is a 24 y.o. female.   Patient is a 23 year old female with a history of type 1 diabetes, hypertension, cannabis hyperemesis syndrome, recent diagnosis of endometriosis who is presenting with complaints of abdominal pain, nausea and vomiting.  Patient reports symptoms have been going on for about a week but has been waxing and waning.  She was seen at the hospital on Friday and at that time received droperidol  and morphine  with no evidence of DKA.  She initially was feeling a little better but then has had ongoing vomiting.  Today the pelvic pain is worse.  She reports she has not taken any insulin  since Friday and has not been checking her blood sugar.  She has not had a bowel movement in almost a week and reports she is not eating.  She states this feels just like the other time she has had pain.  Last menses was 2 months ago.  She does report irregular periods.  She denies any dysuria frequency or urgency.  No fevers.  She tried taking meds at home but they are not helping.  The history is provided by the patient.  Abdominal Pain      Prior to Admission medications   Medication Sig Start Date End Date Taking? Authorizing Provider  HYDROcodone -acetaminophen  (NORCO/VICODIN) 5-325 MG tablet Take 1 tablet by mouth every 6 (six) hours as needed for severe pain (pain score 7-10). 07/29/23  Yes Doretha Folks, MD  metoCLOPramide  (REGLAN ) 10 MG tablet Take 1 tablet (10 mg total) by mouth every 6 (six) hours as needed for nausea or vomiting. 07/29/23  Yes Doretha Folks, MD  promethazine  (PHENERGAN ) 25 MG suppository Place 1 suppository (25 mg total) rectally every 6 (six) hours as needed for nausea or vomiting. 07/29/23  Yes Kerin Kren, Folks, MD  Erenumab -aooe (AIMOVIG ) 140 MG/ML SOAJ Inject 140 mg into the  skin every 28 (twenty-eight) days. 03/06/22   Skeet Juliene SAUNDERS, DO  fluconazole  (DIFLUCAN ) 150 MG tablet Take 1 tab, repeat in 72 hours if no improvement. 03/08/23   Frann Mabel Mt, DO  glucose blood (ACCU-CHEK GUIDE) test strip 1 each by Other route 3 (three) times daily. Use as instructed 06/09/22   Shamleffer, Ibtehal Jaralla, MD  hyoscyamine  (LEVSIN  SL) 0.125 MG SL tablet DISSOLVE 1 TABLET(0.125 MG) UNDER THE TONGUE EVERY 8 HOURS AS NEEDED 01/15/23   Kennedy-Smith, Colleen M, NP  insulin  aspart (NOVOLOG  FLEXPEN) 100 UNIT/ML FlexPen Max daily 90 units 06/09/22   Shamleffer, Ibtehal Jaralla, MD  insulin  degludec (TRESIBA  FLEXTOUCH) 100 UNIT/ML FlexTouch Pen Inject 24 Units into the skin daily. 06/09/22   Shamleffer, Ibtehal Jaralla, MD  Insulin  Pen Needle 32G X 4 MM MISC 1 Device by Does not apply route in the morning, at noon, in the evening, and at bedtime. 06/09/22   Shamleffer, Ibtehal Jaralla, MD  naproxen  (NAPROSYN ) 500 MG tablet Take 1 tablet (500 mg total) by mouth 2 (two) times daily with a meal. 01/14/22   Wendling, Mabel Mt, DO  omeprazole  (PRILOSEC) 20 MG capsule Take 1 capsule (20 mg total) by mouth daily. Take 30 minutes before breakfast. 08/31/22   Cara Elida HERO, NP  omeprazole  (PRILOSEC) 20 MG capsule Take 1 capsule (20 mg total) by mouth daily. 10/13/22   Nandigam, Kavitha V, MD  ondansetron  (ZOFRAN -ODT) 4 MG disintegrating tablet  Take 1 tablet (4 mg total) by mouth every 8 (eight) hours as needed for nausea or vomiting. 03/08/23   Frann Mabel Mt, DO  SUMAtriptan  6 MG/0.5ML SOAJ Inject 6 mg into the skin as needed. May repeat after 1 hour.  Maximum 2 injections in 24 hours. 03/06/22   Skeet Juliene SAUNDERS, DO    Allergies: Patient has no known allergies.    Review of Systems  Gastrointestinal:  Positive for abdominal pain.    Updated Vital Signs BP 112/79   Pulse 79   Temp 98.3 F (36.8 C)   Resp 10   Ht 5' 10 (1.778 m)   Wt 54.4 kg   LMP 06/03/2023  (Approximate)   SpO2 97%   BMI 17.22 kg/m   Physical Exam Vitals and nursing note reviewed.  Constitutional:      General: She is not in acute distress.    Appearance: She is well-developed.     Comments: Tearful on exam  HENT:     Head: Normocephalic and atraumatic.     Mouth/Throat:     Mouth: Mucous membranes are dry.  Eyes:     Pupils: Pupils are equal, round, and reactive to light.  Cardiovascular:     Rate and Rhythm: Regular rhythm. Tachycardia present.     Pulses: Normal pulses.     Heart sounds: Normal heart sounds. No murmur heard.    No friction rub.  Pulmonary:     Effort: Pulmonary effort is normal.     Breath sounds: Normal breath sounds. No wheezing or rales.  Abdominal:     General: Bowel sounds are normal. There is no distension.     Palpations: Abdomen is soft.     Tenderness: There is abdominal tenderness in the suprapubic area. There is no right CVA tenderness, left CVA tenderness, guarding or rebound.  Musculoskeletal:        General: No tenderness. Normal range of motion.     Right lower leg: No edema.     Left lower leg: No edema.     Comments: No edema  Skin:    General: Skin is warm and dry.     Findings: No rash.  Neurological:     Mental Status: She is alert and oriented to person, place, and time. Mental status is at baseline.     Cranial Nerves: No cranial nerve deficit.  Psychiatric:        Behavior: Behavior normal.     (all labs ordered are listed, but only abnormal results are displayed) Labs Reviewed  COMPREHENSIVE METABOLIC PANEL WITH GFR - Abnormal; Notable for the following components:      Result Value   Chloride 94 (*)    Glucose, Bld 347 (*)    Anion gap 20 (*)    All other components within normal limits  CBC - Abnormal; Notable for the following components:   RBC 5.20 (*)    All other components within normal limits  URINALYSIS, ROUTINE W REFLEX MICROSCOPIC - Abnormal; Notable for the following components:   APPearance  CLOUDY (*)    Glucose, UA >=500 (*)    Hgb urine dipstick TRACE (*)    Bilirubin Urine SMALL (*)    Ketones, ur >=80 (*)    Protein, ur 30 (*)    All other components within normal limits  BASIC METABOLIC PANEL WITH GFR - Abnormal; Notable for the following components:   Chloride 97 (*)    Glucose, Bld 294 (*)  All other components within normal limits  URINALYSIS, MICROSCOPIC (REFLEX) - Abnormal; Notable for the following components:   Bacteria, UA MANY (*)    All other components within normal limits  I-STAT VENOUS BLOOD GAS, ED - Abnormal; Notable for the following components:   pH, Ven 7.436 (*)    pCO2, Ven 43.3 (*)    pO2, Ven 24 (*)    Bicarbonate 29.2 (*)    Acid-Base Excess 4.0 (*)    Hemoglobin 15.6 (*)    All other components within normal limits  CBG MONITORING, ED - Abnormal; Notable for the following components:   Glucose-Capillary 288 (*)    All other components within normal limits  LIPASE, BLOOD  PREGNANCY, URINE    EKG: EKG Interpretation Date/Time:  Thursday July 29 2023 16:31:42 EDT Ventricular Rate:  92 PR Interval:  134 QRS Duration:  84 QT Interval:  357 QTC Calculation: 442 R Axis:   77  Text Interpretation: Sinus rhythm Borderline T abnormalities, anterior leads No significant change since last tracing Confirmed by Doretha Folks ([id number]) on 07/29/2023 4:40:56 PM  Radiology: No results found.   Procedures   Medications Ordered in the ED  lactated ringers  infusion (0 mLs Intravenous Stopped 07/29/23 1906)  lactated ringers  bolus 1,000 mL (0 mLs Intravenous Stopped 07/29/23 1640)  morphine  (PF) 4 MG/ML injection 4 mg (4 mg Intravenous Given 07/29/23 1639)  droperidol  (INAPSINE ) 2.5 MG/ML injection 2.5 mg (2.5 mg Intravenous Given 07/29/23 1715)                                    Medical Decision Making Amount and/or Complexity of Data Reviewed Labs: ordered. Decision-making details documented in ED Course. ECG/medicine tests: ordered  and independent interpretation performed. Decision-making details documented in ED Course.  Risk Prescription drug management.   Pt with multiple medical problems and comorbidities and presenting today with a complaint that caries a high risk for morbidity and mortality.  Here today with the above complaints.  Concern for DKA versus hyperemesis syndrome versus pain from endometriosis versus UTI and pyelonephritis.  Lower suspicion for pregnancy as she had a negative test last week, TOA or PID.  Patient denies any infectious symptoms but also concern for dehydration and AKI.  Lower suspicion for obstruction.  Patient had a CT done in March which was negative.  Patient given IV fluids, labs are pending. 8:02 PM I independently reviewed and interpreted patient's labs and EKG.  EKG with a sinus rhythm with no acute findings and normal QT.  VBG with mildly elevated pH and CO2 of 43 with a bicarb of 29, lipase within normal limits, CMP with a blood sugar of 347 and normal creatinine and electrolytes with an anion gap of 20, CBC without acute findings.  UA does have greater than 80 ketones.  After a liter of fluid, antiemetics patient is feeling better and now tolerating p.o.'s. Repeat BMP shows that the gap is now closed and anion gap is 14 and electrolytes are improved.  Blood sugar still elevated and patient reports she will take her insulin  when she gets home.      Final diagnoses:  Dehydration  Recurrent abdominal pain    ED Discharge Orders          Ordered    metoCLOPramide  (REGLAN ) 10 MG tablet  Every 6 hours PRN        07/29/23 1955    HYDROcodone -acetaminophen  (  NORCO/VICODIN) 5-325 MG tablet  Every 6 hours PRN        07/29/23 1955    promethazine  (PHENERGAN ) 25 MG suppository  Every 6 hours PRN        07/29/23 1955               Doretha Folks, MD 07/29/23 2003

## 2023-09-21 ENCOUNTER — Ambulatory Visit: Payer: Self-pay | Admitting: *Deleted

## 2023-09-21 ENCOUNTER — Encounter: Payer: Self-pay | Admitting: Family Medicine

## 2023-09-21 NOTE — Telephone Encounter (Signed)
 FYI Only or Action Required?: FYI only for provider.  Patient was last seen in primary care on 04/29/2023 by Frann Mabel Mt, DO.  Called Nurse Triage reporting Vomiting.  Symptoms began several days ago.  Interventions attempted: Rest, hydration, or home remedies.  Symptoms are: unchanged.  Triage Disposition: See Physician Within 24 Hours  Patient/caregiver understands and will follow disposition?: Yes                Copied from CRM [id number]. Topic: Clinical - Red Word Triage >> Sep 21, 2023  2:42 PM Mesmerise C wrote: [facility] that prompted transfer to Nurse Triage: Patient stated her stomach was swelling up for 3-4 days now, pain with it was in the hospital discharged today has been throwing up along with headaches Reason for Disposition  [1] MILD or MODERATE vomiting AND [2] present > 48 hours (2 days)  (Exception: Mild vomiting with associated diarrhea.)  Answer Assessment - Initial Assessment Questions Appt tomorrow.         1. VOMITING SEVERITY: How many times have you vomited in the past 24 hours?      Last night  2. ONSET: When did the vomiting begin?      On and off since discharge from hospital in August 3. FLUIDS: What fluids or food have you vomited up today? Have you been able to keep any fluids down?     water 4. ABDOMEN PAIN: Are your having any abdomen pain? If Yes : How bad is it and what does it feel like? (e.g., crampy, dull, intermittent, constant)      None now  5. DIARRHEA: Is there any diarrhea? If Yes, ask: How many times today?      na 6. CONTACTS: Is there anyone else in the family with the same symptoms?      na 7. CAUSE: What do you think is causing your vomiting?     See hx  8. HYDRATION STATUS: Any signs of dehydration? (e.g., dry mouth [not only dry lips], too weak to stand) When did you last urinate?     no 9. OTHER SYMPTOMS: Do you have any other symptoms? (e.g., fever, headache, vertigo,  vomiting blood or coffee grounds, recent head injury)     Headache, vomiting at times.  Abdominal swelling comes and goes. Last BM last night small pebbles 10. PREGNANCY: Is there any chance you are pregnant? When was your last menstrual period?       na  Protocols used: Vomiting-A-AH

## 2023-09-22 ENCOUNTER — Ambulatory Visit (INDEPENDENT_AMBULATORY_CARE_PROVIDER_SITE_OTHER): Admitting: Family Medicine

## 2023-09-22 VITALS — BP 110/78 | HR 100 | Temp 98.0°F | Resp 16 | Ht 70.0 in | Wt 123.6 lb

## 2023-09-22 DIAGNOSIS — R109 Unspecified abdominal pain: Secondary | ICD-10-CM | POA: Diagnosis not present

## 2023-09-22 DIAGNOSIS — E1065 Type 1 diabetes mellitus with hyperglycemia: Secondary | ICD-10-CM

## 2023-09-22 DIAGNOSIS — G8929 Other chronic pain: Secondary | ICD-10-CM

## 2023-09-22 DIAGNOSIS — K920 Hematemesis: Secondary | ICD-10-CM | POA: Diagnosis not present

## 2023-09-22 MED ORDER — PANTOPRAZOLE SODIUM 40 MG PO TBEC: 40.0000 mg | DELAYED_RELEASE_TABLET | Freq: Every day | ORAL | 1 refills | Status: DC

## 2023-09-22 NOTE — Progress Notes (Signed)
 Chief Complaint  Patient presents with   Follow-up    ER Follow Up    Subjective: Patient is a 23 y.o. female here for ER follow-up.  Patient went to the ER on 09/10/2023 for throwing up blood.  It was recommended she follow-up here to be referred to a GI specialist.  She will sometimes have sulfur  burps but not currently.  Eating does not seem to affect her pain.  She feels like her stomach region is swollen.  Sometimes when she eats that she will have burning in this area.  Denies blood in her stool or diarrhea.  Sugars have been elevated.  Past Medical History:  Diagnosis Date   Depression    Diabetes mellitus type I (HCC)    Follows with Endo   Hypertension     Objective: BP 110/78 (BP Location: Left Arm, Patient Position: Sitting)   Pulse 100   Temp 98 F (36.7 C) (Oral)   Resp 16   Ht 5' 10 (1.778 m)   Wt 123 lb 9.6 oz (56.1 kg)   SpO2 99%   BMI 17.73 kg/m  General: Awake, appears stated age Heart: RRR, no LE edema Lungs: CTAB, no rales, wheezes or rhonchi. No accessory muscle use Abdomen: Bowel sounds present, soft, nondistended, TTP in the left upper quadrant region as well as the suprapubic region (chronic) Psych: Age appropriate judgment and insight, normal affect and mood  Assessment and Plan: Hematemesis with nausea - Plan: Ambulatory referral to Gastroenterology  Type 1 diabetes mellitus with hyperglycemia (HCC) - Plan: Ambulatory referral to Endocrinology  Chronic abdominal pain - Plan: pantoprazole  (PROTONIX ) 40 MG tablet  Refer to GI for their opinion. Refer to [facility] through [facility] at her request. Chronic issue that is not currently controlled.  She has not been taking her omeprazole .  We will try pantoprazole  40 mg daily.  If not having any improvement, will stop taking it after 2 weeks. Follow-up as originally scheduled. The patient voiced understanding and agreement to the plan.  Mabel Mt [place], DO 09/22/23  2:22 PM

## 2023-09-22 NOTE — Patient Instructions (Signed)
 Stay hydrated.   If you do not hear anything about your referral in the next 1-2 weeks, call our office and ask for an update.  If no improvement with the pantoprazole  in the next 2 weeks, stop.   Let us  know if you need anything.

## 2023-10-01 ENCOUNTER — Encounter: Payer: Self-pay | Admitting: Family Medicine

## 2023-10-04 ENCOUNTER — Ambulatory Visit (INDEPENDENT_AMBULATORY_CARE_PROVIDER_SITE_OTHER): Admitting: Family Medicine

## 2023-10-04 ENCOUNTER — Encounter: Payer: Self-pay | Admitting: Family Medicine

## 2023-10-04 VITALS — BP 102/60 | HR 100 | Temp 99.2°F | Resp 18 | Ht 70.0 in | Wt 126.0 lb

## 2023-10-04 DIAGNOSIS — G43109 Migraine with aura, not intractable, without status migrainosus: Secondary | ICD-10-CM

## 2023-10-04 MED ORDER — NURTEC 75 MG PO TBDP: ORAL_TABLET | ORAL | 2 refills | Status: AC

## 2023-10-04 MED ORDER — KETOROLAC TROMETHAMINE 60 MG/2ML IM SOLN
60.0000 mg | Freq: Once | INTRAMUSCULAR | Status: AC
  Administered 2023-10-04: 60 mg via INTRAMUSCULAR

## 2023-10-04 NOTE — Progress Notes (Signed)
 Chief Complaint  Patient presents with   Follow-up    unchanged    Subjective: Patient is a 23 y.o. female here for follow-up.  Patient went to the ER 09/30/2023.  She is having sulfur  burps.  She is compliant with Protonix  40 mg daily.  The ER sent in Pepcid.  She has not taken this just yet.  She is having intermittent pain.  GI appointment set for around 2 months.  She has a history of migraines.  She is not currently taking a prophylactic medication as the frequency and severity of her migraines has been relatively well-controlled.  She does have a frontal headache right now.  She failed rizatriptan  and sumatriptan .  Past Medical History:  Diagnosis Date   Depression    Diabetes mellitus type I (HCC)    Follows with Endo   Hypertension     Objective: BP 102/60   Pulse 100   Temp 99.2 F (37.3 C)   Resp 18   Ht 5' 10 (1.778 m)   Wt 126 lb (57.2 kg)   SpO2 98%   BMI 18.08 kg/m  General: Awake, appears stated age Neuro: DTRs equal and symmetric throughout, no clonus, no cerebellar signs, gait is normal, 5/5 strength throughout MSK: Mild TTP over the temporalis, TMJ, and frontal sinuses bilaterally. Lungs: No accessory muscle use Psych: Age appropriate judgment and insight, normal affect and mood  Assessment and Plan: Migraine with aura and without status migrainosus, not intractable - Plan: Rimegepant Sulfate (NURTEC) 75 MG TBDP  Chronic, not controlled.  Toradol  injection 60 mg IM today.  Stop triptan as she has now failed to in rizatriptan  and sumatriptan .  Start Nurtec 75 mg under the tongue daily as needed. The patient voiced understanding and agreement to the plan.  Mabel Mt [place], [place] 10/04/23  3:33 PM

## 2023-11-10 ENCOUNTER — Other Ambulatory Visit: Payer: Self-pay

## 2023-11-10 ENCOUNTER — Other Ambulatory Visit (HOSPITAL_BASED_OUTPATIENT_CLINIC_OR_DEPARTMENT_OTHER): Payer: Self-pay

## 2023-11-10 MED ORDER — DEXCOM G7 SENSOR MISC
2 refills | Status: AC
  Filled 2023-11-10 – 2023-11-19 (×2): qty 9, 90d supply, fill #0

## 2023-11-10 MED ORDER — GVOKE HYPOPEN 1-PACK 1 MG/0.2ML ~~LOC~~ SOAJ
0.2000 mL | SUBCUTANEOUS | 2 refills | Status: AC | PRN
  Filled 2023-11-10: qty 0.4, 30d supply, fill #0

## 2023-11-10 MED ORDER — INSULIN PEN NEEDLE 32G X 4 MM MISC
4 refills | Status: AC
  Filled 2023-11-10: qty 400, 90d supply, fill #0

## 2023-11-10 MED ORDER — INSULIN DEGLUDEC 100 UNIT/ML ~~LOC~~ SOPN
24.0000 [IU] | PEN_INJECTOR | Freq: Every day | SUBCUTANEOUS | 1 refills | Status: AC
  Filled 2023-11-10: qty 21, 87d supply, fill #0

## 2023-11-19 ENCOUNTER — Other Ambulatory Visit (HOSPITAL_BASED_OUTPATIENT_CLINIC_OR_DEPARTMENT_OTHER): Payer: Self-pay

## 2023-11-22 ENCOUNTER — Other Ambulatory Visit (HOSPITAL_BASED_OUTPATIENT_CLINIC_OR_DEPARTMENT_OTHER): Payer: Self-pay

## 2023-11-23 ENCOUNTER — Ambulatory Visit: Payer: Self-pay

## 2023-11-23 ENCOUNTER — Other Ambulatory Visit (HOSPITAL_BASED_OUTPATIENT_CLINIC_OR_DEPARTMENT_OTHER): Payer: Self-pay

## 2023-11-23 ENCOUNTER — Ambulatory Visit (INDEPENDENT_AMBULATORY_CARE_PROVIDER_SITE_OTHER): Admitting: Family Medicine

## 2023-11-23 ENCOUNTER — Encounter: Payer: Self-pay | Admitting: Family Medicine

## 2023-11-23 VITALS — BP 112/74 | HR 82 | Temp 98.0°F | Resp 16 | Ht 70.0 in | Wt 121.6 lb

## 2023-11-23 DIAGNOSIS — R109 Unspecified abdominal pain: Secondary | ICD-10-CM

## 2023-11-23 MED ORDER — HYDROCODONE-ACETAMINOPHEN 5-325 MG PO TABS
1.0000 | ORAL_TABLET | Freq: Four times a day (QID) | ORAL | 0 refills | Status: DC | PRN
  Filled 2023-11-23: qty 21, 6d supply, fill #0

## 2023-11-23 NOTE — Patient Instructions (Signed)
 Let me know if you need more Zofran .  Stay hydrated.  Let us  know if you need anything.

## 2023-11-23 NOTE — Telephone Encounter (Signed)
 FYI Only or Action Required?: Action required by provider: request for appointment, clinical question for provider, update on patient condition, and patient doesn't want to go to Urgent Care.  Patient was last seen in primary care on 10/04/2023 by Frann Mabel Mt, DO.  Called Nurse Triage reporting Abdominal Pain.  Symptoms began off and on since Feb 2024 -- dx with gastritis.  Interventions attempted: Prescription medications: pain medication as prescribed and Rest, hydration, or home remedies.  Symptoms are: gradually worsening.  Triage Disposition: See HCP Within 4 Hours (Or PCP Triage)  Patient/caregiver understands and will follow disposition?: No, wishes to speak with PCP               Copied from CRM [id number]. Topic: Clinical - Red Word Triage >> Nov 23, 2023  8:05 AM Mia F wrote: Red Word that prompted transfer to Nurse Triage: Stomach pain. Anytime she emptys her bladder she has pain but it is more of an ache. She said she also has nausea and vomiting with the pain. . She says parts of her stomach swells and pokes out. Reason for Disposition  [1] MILD-MODERATE pain AND [2] constant AND [3] present > 2 hours  Answer Assessment - Initial Assessment Questions Abdominal pain for several days off and on---constant today since 5am---states this has been an off and on issue since Feb 2024 Blood sugar at is 208 at this time during triage 8:15am  Aching after urinating Hx of gastritis and endometriosis Patient states at the end of urination she feels some pain but it isnt a burning pain---she said it could be urinary related but it feels like the usual pain she has seen Dr Frann for multiple times Patient states that she has some pain medication left and she wants to see her provider Patient states she has an appointment with [facility] but it isnt for a few weeks she states Patient was advised it was recommended to be seen today but there were no openings in her PCP  office. She also didn't want to go to Urgent Care today and wanted to see her provider since he was familiar with her medical history she states  Patient is advised to call us  back if anything changes or with any further questions/concerns. Patient is advised that if anything worsens to go to the Emergency Room. Patient verbalized understanding.    1. LOCATION: Where does it hurt?      Lower left area and left of midline 2. RADIATION: Does the pain shoot anywhere else? (e.g., chest, back)     -- 3. ONSET: When did the pain begin? (e.g., minutes, hours or days ago)      Several days ago 4. SUDDEN: Gradual or sudden onset?     Off and on for several days  but started back at about 5am 5. PATTERN Does the pain come and go, or is it constant?     constant 6. SEVERITY: How bad is the pain?  (e.g., Scale 1-10; mild, moderate, or severe)     6-6.5 7. RECURRENT SYMPTOM: Have you ever had this type of stomach pain before? If Yes, ask: When was the last time? and What happened that time?      Yes---hx of gastritis 8. CAUSE: What do you think is causing the stomach pain? (e.g., gallstones, recent abdominal surgery)     unsure 9. RELIEVING/AGGRAVATING FACTORS: What makes it better or worse? (e.g., antacids, bending or twisting motion, bowel movement)     ----- 10. OTHER  SYMPTOMS: Do you have any other symptoms? (e.g., back pain, diarrhea, fever, urination pain, vomiting)       Nausea, vomiting,  11. PREGNANCY: Is there any chance you are pregnant? When was your last menstrual period?       No  Protocols used: Abdominal Pain - Female-A-AH

## 2023-11-23 NOTE — Progress Notes (Signed)
 Chief Complaint  Patient presents with   GI Problem    Stomach Pain    Subjective: Patient is a 23 y.o. female here for f/u.  Patient has chronic abdominal pain.  It is associated with nausea and vomiting.  She sees a gastroenterology team next week.  Bowel movements have been somewhat loose.  She is seeing endocrinology for her type 1 diabetes.  Most recent A1c was 15.  She is getting set up for a pump and CGM.  She has been on Reglan  in the past.  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  Zofran  does help somewhat with the nausea.  She has been taking hydrocodone  intermittently for pain which does help for short periods of time.  She is requesting a refill until she can get in with the gastroenterology team.  Past Medical History:  Diagnosis Date   Depression    Diabetes mellitus type I (HCC)    Follows with Endo   Hypertension     Objective: BP 112/74 (BP Location: Left Arm, Patient Position: Sitting)   Pulse 82   Temp 98 F (36.7 C) (Oral)   Resp 16   Ht 5' 10 (1.778 m)   Wt 121 lb 9.6 oz (55.2 kg)   SpO2 98%   BMI 17.45 kg/m  General: Awake, appears stated age Mouth: MMM Abdomen: Bowel sounds present, soft, nondistended, diffuse TTP Heart: RRR Lungs: CTAB, no rales, wheezes or rhonchi. No accessory muscle use Psych: Age appropriate judgment and insight, normal affect and mood  Assessment and Plan: Abdominal pain, unspecified abdominal location  Refill hydrocodone  until she can get in with the gastroenterology team next week.  Continue Zofran  as needed. The patient voiced understanding and agreement to the plan.  Mabel Mt [place], DO 11/23/23  4:41 PM

## 2023-12-01 ENCOUNTER — Other Ambulatory Visit (HOSPITAL_BASED_OUTPATIENT_CLINIC_OR_DEPARTMENT_OTHER): Payer: Self-pay

## 2023-12-01 ENCOUNTER — Other Ambulatory Visit: Payer: Self-pay

## 2023-12-01 MED ORDER — OMEPRAZOLE 40 MG PO CPDR
40.0000 mg | DELAYED_RELEASE_CAPSULE | Freq: Every morning | ORAL | 3 refills | Status: AC
  Filled 2023-12-01: qty 90, 90d supply, fill #0

## 2023-12-07 ENCOUNTER — Other Ambulatory Visit: Payer: Self-pay

## 2023-12-07 ENCOUNTER — Other Ambulatory Visit (HOSPITAL_BASED_OUTPATIENT_CLINIC_OR_DEPARTMENT_OTHER): Payer: Self-pay

## 2023-12-07 MED ORDER — TRESIBA FLEXTOUCH 100 UNIT/ML ~~LOC~~ SOPN
24.0000 [IU] | PEN_INJECTOR | Freq: Every day | SUBCUTANEOUS | 1 refills | Status: AC
  Filled 2023-12-07: qty 15, 62d supply, fill #0

## 2023-12-07 MED ORDER — INSULIN ASPART 100 UNIT/ML IJ SOLN
INTRAMUSCULAR | 11 refills | Status: AC
  Filled 2023-12-07: qty 30, 30d supply, fill #0

## 2023-12-08 ENCOUNTER — Other Ambulatory Visit (HOSPITAL_BASED_OUTPATIENT_CLINIC_OR_DEPARTMENT_OTHER): Payer: Self-pay

## 2023-12-10 ENCOUNTER — Other Ambulatory Visit (HOSPITAL_BASED_OUTPATIENT_CLINIC_OR_DEPARTMENT_OTHER): Payer: Self-pay

## 2023-12-13 NOTE — Progress Notes (Signed)
 Encounter Diagnosis  Name Primary?  [name] Uncontrolled type 1 diabetes mellitus with hyperglycemia    (CMD) Yes   Pump start: Tandem Mobi with Control IQ  This visit was conducted as a: Visit Type: Office visit This visit was conducted as a: Appointment Type: an individual appointment. individual Appts: No class appropriate for patient at this time.  Time In: 10:00 AM Time Out: 12:00 PM Total Time With Patient: 120 minutes  Subjective:  Tina Fry presents today for insulin  pump start using the Tandem Mobi system with Control IQ.  Tina Fry is currently using MDI to manage her T1D.  Patient is using Dexcom G7 for monitoring BG's. Patient reports using pumps in the past, currently doing MDI.  She also reports using set doses for meals but has done carb counting in the past.   Current Insulin  regimen:  Pump settings were reviewed and approved by provider, initial pump setting sheet scanned into media tab.  Pump settings: pump adjustments in bold Basal Rates Insulin  to Carb Ratios Insulin  Sensitivity/Correction Target BG   MN 0.8 MN 12 MN 44 MN 110                    Total Basal insulin  = 19.2 units/day.  Active insulin  time = 5 hours  Education: From Tandem Insulin  Pump Training Checklist  Basic concepts of pump therapy (basal, bolus, ICR, ISF, IOB, AID) Pump overview/Mobi app  Personal Profiles (ICR, ICF, BG Target, Basal Rates) Infusion sets  Alert Settings (Low BG-70, High BG-250, Low insulin , Auto off) Pump Settings - personal profile Pump Info Serial Number [id number]; Pin [id number] Loading/changing cartridge Delivering Boluses (Entering carbs, blood sugar, extending bolus) Quick bolus set to 5 grams carb to use if phone battery dies. Control IQ technology  - CGM values used to adjust insulin  delivery rates and amounts, BG target ranges, reviewed all Control IQ icons, sleep and exercise activities, automatic correction bolus (60%), temporary basal rate will be  off when using control IQ. Charging Mobi pump (10-15 minutes daily), fully charged pump should last 3-5 days. Connecting Dexcom CGM, keep pump close to CGM for optimal connection. T-Connect account set up, tconnect app set up and linked to clinic. Review of hypoglycemia s/s, tx, rule of 15, patient has fast acting carbohydrate available. Cartridge filled with 200 units, infusion set inserted into right lower abdomen .  Pump back up plan reviewed.  Discussed importance of having back up insulin  supplies in the event of pump failure.  Tina Fry verbalizes and demonstrates understanding of today's pump training and has no additional questions.    Follow-Up Plan:  Follow up: 12/30/2023 for pump download, CGM review and adjustments if needed. Call tclinic for blood sugar review and titration if needed. Monitor blood sugar pre-prandial, bedtime with Dexcom CGM Call clinic for BG patterns of hypoglycemia < 70mg /dl or hyperglycemia > 749 mg/dl. Patient is connected to clinic tconnect and dexcom accounts for pump and CGM review as needed.  PLAN OF CARE:  Patient understanding: Aleeya verbalized understanding of education provided and plan of care.  Expected adherence:  good  Reinforcement needed:  Yes, patient will need continued support to meet behavior change goals.   [name], RDN, LDN, CDCES Certified Diabetes Care and Education Specialist Diabetes Educator[facility]  Referring provider has access to education plan/topics discussed and outcomes through shared EMR.

## 2023-12-22 ENCOUNTER — Other Ambulatory Visit (HOSPITAL_BASED_OUTPATIENT_CLINIC_OR_DEPARTMENT_OTHER): Payer: Self-pay

## 2023-12-27 ENCOUNTER — Telehealth: Admitting: Physician Assistant

## 2023-12-27 ENCOUNTER — Other Ambulatory Visit (HOSPITAL_BASED_OUTPATIENT_CLINIC_OR_DEPARTMENT_OTHER): Payer: Self-pay

## 2023-12-27 DIAGNOSIS — K047 Periapical abscess without sinus: Secondary | ICD-10-CM

## 2023-12-27 MED ORDER — AMOXICILLIN-POT CLAVULANATE 875-125 MG PO TABS
1.0000 | ORAL_TABLET | Freq: Two times a day (BID) | ORAL | 0 refills | Status: DC
  Filled 2023-12-27: qty 14, 7d supply, fill #0

## 2023-12-27 NOTE — Progress Notes (Signed)
 E-Visit for Dental Pain  We are sorry that you are not feeling well.  Here is how we plan to help!  Based on what you have shared with me in the questionnaire, it sounds like you have a dental infection with a broken tooth.  Augmentin  875-125mg  twice a day for 7 days  It is imperative that you see a dentist within 10 days of this eVisit to determine the cause of the dental pain and be sure it is adequately treated  A toothache or tooth pain is caused when the nerve in the root of a tooth or surrounding a tooth is irritated. Dental (tooth) infection, decay, injury, or loss of a tooth are the most common causes of dental pain. Pain may also occur after an extraction (tooth is pulled out). Pain sometimes originates from other areas and radiates to the jaw, thus appearing to be tooth pain.Bacteria growing inside your mouth can contribute to gum disease and dental decay, both of which can cause pain. A toothache occurs from inflammation of the central portion of the tooth called pulp. The pulp contains nerve endings that are very sensitive to pain. Inflammation to the pulp or pulpitis may be caused by dental cavities, trauma, and infection.    HOME CARE:   For toothaches: Over-the-counter pain medications such as acetaminophen  or ibuprofen may be used. Take these as directed on the package while you arrange for a dental appointment. Avoid very cold or hot foods, because they may make the pain worse. You may get relief from biting on a cotton ball soaked in oil of cloves. You can get oil of cloves at most drug stores.  For jaw pain:  Aspirin may be helpful for problems in the joint of the jaw in adults. If pain happens every time you open your mouth widely, the temporomandibular joint (TMJ) may be the source of the pain. Yawning or taking a large bite of food may worsen the pain. An appointment with your doctor or dentist will help you find the cause.     GET HELP RIGHT AWAY IF:  You have a  high fever or chills If you have had a recent head or face injury and develop headache, light headedness, nausea, vomiting, or other symptoms that concern you after an injury to your face or mouth, you could have a more serious injury in addition to your dental injury. A facial rash associated with a toothache: This condition may improve with medication. Contact your doctor for them to decide what is appropriate. Any jaw pain occurring with chest pain: Although jaw pain is most commonly caused by dental disease, it is sometimes referred pain from other areas. People with heart disease, especially people who have had stents placed, people with diabetes, or those who have had heart surgery may have jaw pain as a symptom of heart attack or angina. If your jaw or tooth pain is associated with lightheadedness, sweating, or shortness of breath, you should see a doctor as soon as possible. Trouble swallowing or excessive pain or bleeding from gums: If you have a history of a weakened immune system, diabetes, or steroid use, you may be more susceptible to infections. Infections can often be more severe and extensive or caused by unusual organisms. Dental and gum infections in people with these conditions may require more aggressive treatment. An abscess may need draining or IV antibiotics, for example.  MAKE SURE YOU   Understand these instructions. Will watch your condition. Will get help right  away if you are not doing well or get worse.  Thank you for choosing an e-visit.  Your e-visit answers were reviewed by a board certified advanced clinical practitioner to complete your personal care plan. Depending upon the condition, your plan could have included both over the counter or prescription medications.  Please review your pharmacy choice. Make sure the pharmacy is open so you can pick up prescription now. If there is a problem, you may contact your provider through [employer] and have the  prescription routed to another pharmacy.  Your safety is important to us . If you have drug allergies check your prescription carefully.   For the next 24 hours you can use MyChart to ask questions about today's visit, request a non-urgent call back, or ask for a work or school excuse. You will get an email in the next two days asking about your experience. I hope that your e-visit has been valuable and will speed your recovery.  I have spent 5 minutes in review of e-visit questionnaire, review and updating patient chart, medical decision making and response to patient.   Delon CHRISTELLA Dickinson, PA-C

## 2023-12-31 ENCOUNTER — Encounter: Payer: Self-pay | Admitting: Family Medicine

## 2024-01-17 ENCOUNTER — Other Ambulatory Visit (HOSPITAL_BASED_OUTPATIENT_CLINIC_OR_DEPARTMENT_OTHER): Payer: Self-pay

## 2024-01-17 ENCOUNTER — Telehealth: Payer: Self-pay | Admitting: Family Medicine

## 2024-01-17 MED ORDER — PANTOPRAZOLE SODIUM 40 MG PO TBEC
40.0000 mg | DELAYED_RELEASE_TABLET | Freq: Every morning | ORAL | 0 refills | Status: AC
  Filled 2024-01-17: qty 30, 30d supply, fill #0

## 2024-01-17 MED ORDER — METOCLOPRAMIDE HCL 10 MG PO TABS
10.0000 mg | ORAL_TABLET | Freq: Three times a day (TID) | ORAL | 0 refills | Status: AC | PRN
  Filled 2024-01-17: qty 15, 5d supply, fill #0

## 2024-01-17 NOTE — Telephone Encounter (Signed)
 Sent pt message letting her know hydrocodone  was only short term until she can see GI, will need to be seen  If needs refill.

## 2024-01-17 NOTE — Telephone Encounter (Signed)
 This was an acute rx to hold her over until she could get in w GI. Thx.

## 2024-01-17 NOTE — Telephone Encounter (Signed)
 Requesting: hydrocodone  5-325mg  Contract: UDS: Last Visit: 11/23/23 Next Visit: None Last Refill: 11/23/23 #21 and 0RF   Please Advise

## 2024-01-19 ENCOUNTER — Encounter (HOSPITAL_BASED_OUTPATIENT_CLINIC_OR_DEPARTMENT_OTHER): Payer: Self-pay

## 2024-01-19 ENCOUNTER — Other Ambulatory Visit (HOSPITAL_BASED_OUTPATIENT_CLINIC_OR_DEPARTMENT_OTHER): Payer: Self-pay

## 2024-01-31 ENCOUNTER — Ambulatory Visit (INDEPENDENT_AMBULATORY_CARE_PROVIDER_SITE_OTHER): Admitting: Family Medicine

## 2024-01-31 ENCOUNTER — Encounter: Payer: Self-pay | Admitting: Family Medicine

## 2024-01-31 VITALS — BP 118/70 | HR 82 | Temp 98.0°F | Resp 16 | Ht 70.0 in | Wt 125.4 lb

## 2024-01-31 DIAGNOSIS — G8929 Other chronic pain: Secondary | ICD-10-CM

## 2024-01-31 DIAGNOSIS — R109 Unspecified abdominal pain: Secondary | ICD-10-CM

## 2024-01-31 DIAGNOSIS — F4321 Adjustment disorder with depressed mood: Secondary | ICD-10-CM

## 2024-01-31 MED ORDER — NORTRIPTYLINE HCL 10 MG PO CAPS: 10.0000 mg | ORAL_CAPSULE | Freq: Every day | ORAL | 1 refills | Status: AC

## 2024-01-31 NOTE — Patient Instructions (Signed)
 If you do not hear anything about your referral in the next 1-2 weeks, call our office and ask for an update.  Please consider counseling. Contact [phone] to schedule an appointment or inquire about cost/insurance coverage.  [facility] located at [address], [place], [place].  Phone number = [phone].  Dr. Verdel Silk - Adult Psychiatry.    [facility] located at [address] [place], [place], [place]. Phone number = [phone].   [facility] located at [address] [place], [place].  Phone number = [phone].   [facility] located at [address] [place], [place], [place].  Phone number = [phone].  Let us  know if you need anything.

## 2024-01-31 NOTE — Progress Notes (Signed)
 Chief Complaint  Patient presents with   Follow-up    ER Follow Up     Subjective: Patient is a 24 y.o. female here for ER f/u.  Over the past month, the patient has had worsening chronic abdominal pain, nausea, and vomiting.  She has gone to the ER 3 times.  Her gastroenterology team has told her to continue taking Protonix , avoid dietary triggers and avoid narcotics.  She has a history of uncontrolled diabetes following with endocrinology.  Last A1c was greater than 14.  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  She is working with the gynecology team regarding endometriosis.  Denies recent trauma  This is waking her up at night.  She had an upper endoscopy in April 2025 which was normal.  She is compliant with medication.  This is causing a lot of stress.  She failed amitriptyline .  Past Medical History:  Diagnosis Date   Depression    Diabetes mellitus type I (HCC)    Follows with Endo   Hypertension     Objective: BP 118/70 (BP Location: Left Arm, Patient Position: Sitting)   Pulse 82   Temp 98 F (36.7 C) (Oral)   Resp 16   Ht 5' 10 (1.778 m)   Wt 125 lb 6.4 oz (56.9 kg)   SpO2 98%   BMI 17.99 kg/m  General: Awake, appears stated age Abdomen: Bowel sounds present, soft, diffusely tender to palpation, nondistended, no masses Lungs: No accessory muscle use Psych: Age appropriate judgment and insight, normal affect and mood  Assessment and Plan: Chronic abdominal pain - Plan: nortriptyline  (PAMELOR ) 10 MG capsule, Ambulatory referral to Gastroenterology, Ambulatory referral to Pain Clinic  Situational depression - Plan: Ambulatory referral to Psychology  Chronic, not controlled.  Start Pamelor  10 mg daily.  Refer to [facility] pain clinic and gastroenterology for second opinion.  Could consider clonidine at a lower dosage.  Appreciate OB/GYN. Refer to psychology for their opinion.  Suspect there is a psychological  component at least exacerbating her symptoms. The patient voiced understanding and agreement to the plan.  Mabel [place], DO 01/31/24  12:13 PM

## 2024-02-09 ENCOUNTER — Ambulatory Visit: Admitting: Psychology

## 2024-02-09 DIAGNOSIS — F339 Major depressive disorder, recurrent, unspecified: Secondary | ICD-10-CM

## 2024-02-09 NOTE — Progress Notes (Signed)
 Comprehensive Clinical Assessment (CCA) Note  02/09/2024 Tina Fry [id number]  Time Spent: 12:00 pm - 12:40 pm : 40 minutes (We needed to end our session early because patient had a bad toothache and was rushing to the dentist.)  Chief Complaint: I'm here today because I think I have situational depression.  Visit Diagnosis: Major depression, recurrent, chronic [F33.9]    Guardian/Payee:  Scientist, Research (physical Sciences) requested: No  Reason for Visit /Presenting Problem: I have over 10 doctor's and although my PCP and my diabetes doctors are great, I'm having a lot of GI issues, which is why I'm here today as opposed to before this and after this. I also have had type 1 diabetes since I was 24 years old.   Mental Status Exam: Appearance:   Casual     Behavior:  Sharing  Motor:  Restlestness  Speech/Language:   Pressured  Affect:  Depressed  Mood:  depressed  Thought process:  normal  Thought content:    WNL  Sensory/Perceptual disturbances:    WNL  Orientation:  oriented to person, place, and time/date  Attention:  Good  Concentration:  Good  Memory:  WNL  Fund of knowledge:   Good  Insight:    Good  Judgment:   Good  Impulse Control:  Good   Reported Symptoms:  Anxiety: Feeling nervous, anxious or on edge, worrying too much about different things, trouble relaxing, becoming easily annoyed or irritable. (GAD=9)  Depression: little interest or pleasure in doing things, feeling down, depressed, or hopeless, trouble falling or staying asleep, or sleeping too much, feeling tired or having little energy, poor appetite, trouble concentrating on things. Patient also reported that in the past, she'd had thoughts about hurting herself in some way, but she denied having any of those thoughts recently. (PHQ=12)  Risk Assessment: Danger to Self:  No Self-injurious Behavior: No Danger to Others: No Duty to Warn:no Physical Aggression / Violence:No  Access to Firearms a concern: No   Gang Involvement:No  Patient / guardian was educated about steps to take if suicide or homicide risk level increases between visits: yes While future psychiatric events cannot be accurately predicted, the patient does not currently require acute inpatient psychiatric care and does not currently meet [place]  involuntary commitment criteria.  Substance Abuse History: Current substance abuse: No     Caffeine: Tobacco: Alcohol: Substance use: Used marijuana in the past as gummies, but has stopped using it now.   Past Psychiatric History:   Previous psychological history is significant for depression Outpatient Providers:social worker Winton Rubinstein, child psychotherapist, in 2016 History of Psych Hospitalization: No  Psychological Testing: No   Abuse History:  Victim of: No., N/A   Report needed: No. Victim of Neglect:No. Perpetrator of no  Witness / Exposure to Domestic Violence: No   Protective Services Involvement: No  Witness to [employer] Violence:  No   Family History:  Family History  Problem Relation Age of Onset   Anxiety disorder Mother    Depression Mother    Gestational diabetes Mother    Drug abuse Father    Lupus Maternal Grandmother    Hypertension Maternal Grandmother    Alcohol abuse Maternal Grandfather    Depression Maternal Grandfather    Drug abuse Maternal Grandfather    Mental illness Maternal Grandfather    Alcohol abuse Paternal Grandmother    Drug abuse Paternal Grandmother    Diabetes Mellitus II Paternal Grandfather    ADD / ADHD Paternal  Grandfather    Diabetes Paternal Grandfather    Drug abuse Paternal Grandfather    Diabetes Mellitus I Maternal Aunt        Maternal great aunt   Diabetes Mellitus II Paternal Uncle    Stomach cancer Neg Hx    Colon cancer Neg Hx    Rectal cancer Neg Hx    Esophageal cancer Neg Hx     Living situation: the patient lives with her 57 yo son Lanae, and her mom who is 25 years old and works from home for an  scientist, forensic. Patient had to move in with her mother when she lost her job in June 2025.   Sexual Orientation: Straight  Relationship Status: single  Name of spouse / N/A If a parent, number of children / ages:98 year old, Lanae (He is very smart and very good at manipulating, even though he is 25 yo. Out this week due to storm.   Support Systems: Mother who she and her son Lanae live with in her mother's home  Financial Stress:  Yes   Income/Employment/Disability: No income, but applied for disability. 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 That job is kinda strict with time, and we were on mandatory overtime, so it was too much for me because I was vomiting and in too much pain. After I hit my one year I was eligible for FMLA, but forms didn't get filled out in time so I was terminated. I've always been a hardworker and I even got my worker's permit when I was 24 years old.   Military Service: No   Educational History: Education: high school diploma/GED  Religion/Sprituality/World View: I'm not very religious.   Any cultural differences that may affect / interfere with treatment:  not applicable   Recreation/Hobbies: I don't do anything for fun or enjoyment right now, because I'm in so much pain.  Stressors: Financial difficulties   Health problems   Marital or family conflict   Occupational concerns    Strengths: Family, Journalist, [employer]aper, and Able to Communicate Effectively  Barriers:  N/A   Legal History: Pending legal issue / charges: The patient has been involved with the police as a result of I did get a possession .charge, but was able to get it dismissed.  History of legal issue / charges: N/A  Medical History/Surgical History: reviewed Past Medical History:  Diagnosis Date   Depression    Diabetes mellitus type I (HCC)    Follows with Endo   Hypertension      Past Surgical History:  Procedure Laterality Date   NO PAST SURGERIES      Medications: Current Outpatient Medications  Medication Sig Dispense Refill   Continuous Glucose Sensor (DEXCOM G7 SENSOR) MISC Use 1 sensor every 10 days. 9 each 2   Glucagon  (GVOKE HYPOPEN  1-PACK) 1 MG/0.2ML SOAJ Inject 0.2 mLs into the skin as needed. 0.4 mL 2   glucose blood (ACCU-CHEK GUIDE) test strip 1 each by Other route 3 (three) times daily. Use as instructed 300 each 3   insulin  aspart (NOVOLOG  FLEXPEN) 100 UNIT/ML FlexPen Max daily 90 units 90 mL 3   insulin  aspart (NOVOLOG ) 100 UNIT/ML injection Infuse up to 100 units a day through insulin  pump for 30 days 30 mL 11   insulin  degludec (TRESIBA  FLEXTOUCH) 100 UNIT/ML FlexTouch Pen Inject 24 Units into the skin daily. 30 mL  3   insulin  degludec (TRESIBA  FLEXTOUCH) 100 UNIT/ML FlexTouch Pen Inject 24 Units into the skin daily. 15 mL 1   insulin  degludec (TRESIBA ) 100 UNIT/ML FlexTouch Pen Inject 24 Units into the skin daily. 21 mL 1   Insulin  Pen Needle 32G X 4 MM MISC 1 Device by Does not apply route in the morning, at noon, in the evening, and at bedtime. 400 each 3   Insulin  Pen Needle 32G X 4 MM MISC Use up to 4 times daily for insulin  injections 400 each 4   metoCLOPramide  (REGLAN ) 10 MG tablet Take 1 tablet (10 mg total) by mouth every 8 (eight) hours as needed for nausea. 15 tablet 0   nortriptyline  (PAMELOR ) 10 MG capsule Take 1 capsule (10 mg total) by mouth at bedtime. 30 capsule 1   omeprazole  (PRILOSEC) 40 MG capsule Take 1 capsule (40 mg total) by mouth every morning. 90 capsule 3   ondansetron  (ZOFRAN -ODT) 4 MG disintegrating tablet Take 1 tablet (4 mg total) by mouth every 8 (eight) hours as needed for nausea or vomiting. 20 tablet 0   pantoprazole  (PROTONIX ) 40 MG tablet Take 1 tablet (40 mg total) by mouth every morning before breakfast. 30 tablet 0   Rimegepant Sulfate (NURTEC) 75 MG TBDP Dissolve under tongue as needed for migraines.  Repeat in 2 hrs if needed. 16 tablet 2   No current facility-administered medications for this visit.    Allergies[1]  Diagnoses: Major depression, recurrent, chronic [F33.9]    Psychiatric Treatment: Not currently, but is receptive to having medication management appt.  Plan of Care: OPT and medication management referral to psychiatry sent to main office on 02/09/24  Narrative:   Tina Fry participated from home, via video, is aware of tele-sessions limitations, and consented to treatment. Therapist participated from home office. We reviewed the limits of confidentiality prior to the start of the evaluation. Tina Fry expressed understanding and agreement to proceed.   Patient is a 24 year old female who presented for an initial assessment. Patient reported the following symptoms: Anxiety: Feeling nervous, anxious or on edge, worrying too much about different things, trouble relaxing, becoming easily annoyed or irritable. (GAD=9) Depression: little interest or pleasure in doing things, feeling down, depressed, or hopeless, trouble falling or staying asleep, or sleeping too much, feeling tired or having little energy, poor appetite, trouble concentrating on things. Patient also reported that in the past, she'd had thoughts about hurting herself in some way, but she denied having any of those thoughts recently. (PHQ=12) The patient lives with her 61 yo son Lanae, and her mom who is 54 years old and works from home for an scientist, forensic. Patient had to move in with her mother when she lost her job in June 2025. No income, but applied for disability. 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 That job is kinda strict with time, and we were on mandatory overtime, so it was too much for me because I was vomiting and in too much pain. After I hit my one year I was eligible for FMLA, but forms  didn't get filled out in time so I was terminated. I've always been a hardworker and I even got my worker's permit when I was 24 years old.     Patient denied current suicidal ideation, homicidal ideation or symptoms of psychosis.  Patient reported no history of  tobacco use and patient denied current or past alcohol use. Patient had used marijuana gummies in the past, but had stopped using years ago. Patient reported current stressors as Financial difficulties, Health problems, family conflict, and occupational concerns. Patient identified current supports as her mother.      A follow-up was scheduled to create a treatment plan and begin treatment. Therapist answered all questions during the evaluation and contact information was provided.     Jenkins CHRISTELLA Nicolas     [1] No Known Allergies

## 2024-02-16 ENCOUNTER — Encounter: Payer: Self-pay | Admitting: Family Medicine

## 2024-02-23 ENCOUNTER — Ambulatory Visit: Admitting: Psychology
